# Patient Record
Sex: Male | Born: 1970 | Race: White | Hispanic: Yes | Marital: Married | Smoking: Former smoker
Health system: Southern US, Community
[De-identification: ages and names within clinical notes are randomized; demographics above are authoritative.]

## PROBLEM LIST (undated history)

## (undated) DIAGNOSIS — E7800 Pure hypercholesterolemia, unspecified: Secondary | ICD-10-CM

## (undated) DIAGNOSIS — I10 Essential (primary) hypertension: Secondary | ICD-10-CM

## (undated) HISTORY — PX: VASECTOMY: SHX75

---

## 2017-07-24 ENCOUNTER — Other Ambulatory Visit: Payer: Self-pay

## 2017-07-24 ENCOUNTER — Emergency Department (HOSPITAL_BASED_OUTPATIENT_CLINIC_OR_DEPARTMENT_OTHER)
Admission: EM | Admit: 2017-07-24 | Discharge: 2017-07-24 | Disposition: A | Discharge status: Home / Self Care | Payer: 59 | Attending: Emergency Medicine | Admitting: Emergency Medicine

## 2017-07-24 ENCOUNTER — Emergency Department (HOSPITAL_BASED_OUTPATIENT_CLINIC_OR_DEPARTMENT_OTHER): Payer: 59

## 2017-07-24 ENCOUNTER — Encounter (HOSPITAL_BASED_OUTPATIENT_CLINIC_OR_DEPARTMENT_OTHER): Payer: Self-pay

## 2017-07-24 DIAGNOSIS — R0789 Other chest pain: Secondary | ICD-10-CM | POA: Diagnosis present

## 2017-07-24 DIAGNOSIS — Z87891 Personal history of nicotine dependence: Secondary | ICD-10-CM | POA: Insufficient documentation

## 2017-07-24 DIAGNOSIS — M940 Chondrocostal junction syndrome [Tietze]: Secondary | ICD-10-CM | POA: Insufficient documentation

## 2017-07-24 DIAGNOSIS — M79602 Pain in left arm: Secondary | ICD-10-CM | POA: Diagnosis not present

## 2017-07-24 LAB — BASIC METABOLIC PANEL
ANION GAP: 6 (ref 5–15)
BUN: 17 mg/dL (ref 6–20)
CALCIUM: 8.7 mg/dL — AB (ref 8.9–10.3)
CHLORIDE: 100 mmol/L — AB (ref 101–111)
CO2: 29 mmol/L (ref 22–32)
CREATININE: 0.87 mg/dL (ref 0.61–1.24)
GFR calc non Af Amer: >60 mL/min (ref >60)
Glucose, Bld: 95 mg/dL (ref 65–99)
Potassium: 3.8 mmol/L (ref 3.5–5.1)
SODIUM: 135 mmol/L (ref 135–145)

## 2017-07-24 LAB — CBC
HCT: 38.6 % — ABNORMAL LOW (ref 39.0–52.0)
HEMOGLOBIN: 12.7 g/dL — AB (ref 13.0–17.0)
MCH: 30 pg (ref 26.0–34.0)
MCHC: 32.9 g/dL (ref 30.0–36.0)
MCV: 91 fL (ref 78.0–100.0)
PLATELETS: 231 10*3/uL (ref 150–400)
RBC: 4.24 MIL/uL (ref 4.22–5.81)
RDW: 11.7 % (ref 11.5–15.5)
WBC: 9.3 10*3/uL (ref 4.0–10.5)

## 2017-07-24 LAB — TROPONIN I

## 2017-07-24 MED ORDER — IBUPROFEN 600 MG PO TABS: 600.0000 mg | ORAL_TABLET | Freq: Three times a day (TID) | ORAL | 0 refills | Status: AC

## 2017-07-24 MED ORDER — KETOROLAC TROMETHAMINE 15 MG/ML IJ SOLN
15.0000 mg | Freq: Once | INTRAMUSCULAR | Status: AC
  Administered 2017-07-24: 15 mg via INTRAVENOUS
  Filled 2017-07-24: qty 1

## 2017-07-24 MED ORDER — METHOCARBAMOL 1000 MG/10ML IJ SOLN
500.0000 mg | Freq: Once | INTRAMUSCULAR | Status: AC
  Administered 2017-07-24: 500 mg via INTRAVENOUS
  Filled 2017-07-24: qty 10

## 2017-07-24 MED ORDER — METHOCARBAMOL 500 MG PO TABS: 500.0000 mg | ORAL_TABLET | Freq: Three times a day (TID) | ORAL | 0 refills | Status: DC | PRN

## 2017-07-24 NOTE — ED Provider Notes (Signed)
[facility] EMERGENCY DEPARTMENT Provider Note   CSN: [id number] Arrival date & time: 07/24/17  1843     History   Chief Complaint Chief Complaint  Patient presents with  . Chest Pain    HPI Shelbie ProctorJaime Mannella is a 46 y.o. male.  HPI   46 year old male with past medical history as below here with atypical chest pain.  The patient states that over the last 2 weeks, he has had nearly constant left chest pain.  The pain is sharp, stabbing, and positional.  It is worse with weight lifting, which he does regularly.  He denies any associated shortness of breath.  Earlier today, he had some pain that radiated and tingle down his left arm.  He states it radiates towards his left pinky finger. No neck pain with it. Pt's brother just passed from an unexpected heart attack last week so he became concerned. No h/o CAD. He lifts weights and does cardio regularly and was able to run 10 clicks yesterday, after hte onset of pain, without difficulty. His pain did worsen when he was lifting weights though.  History reviewed. No pertinent past medical history.  There are no active problems to display for this patient.   History reviewed. No pertinent surgical history.     Home Medications    Prior to Admission medications   Medication Sig Start Date End Date Taking? Authorizing Provider  ibuprofen (ADVIL,MOTRIN) 600 MG tablet Take 1 tablet (600 mg total) by mouth 3 (three) times daily for 7 days. 07/24/17 07/31/17  Shaune PollackIsaacs, Shizuye Rupert, MD  methocarbamol (ROBAXIN) 500 MG tablet Take 1 tablet (500 mg total) by mouth every 8 (eight) hours as needed for muscle spasms. 07/24/17   Shaune PollackIsaacs, Mekhi Sonn, MD    Family History No family history on file.  Social History Social History   Tobacco Use  . Smoking status: Former Smoker    Last attempt to quit: 07/25/1999    Years since quitting: 18.0  . Smokeless tobacco: Never Used  Substance Use Topics  . Alcohol use: Yes    Comment: occasionally     . Drug use: No     Allergies   Patient has no known allergies.   Review of Systems Review of Systems  Constitutional: Negative for chills, fatigue and fever.  HENT: Negative for congestion and rhinorrhea.   Eyes: Negative for visual disturbance.  Respiratory: Positive for chest tightness. Negative for cough, shortness of breath and wheezing.   Cardiovascular: Positive for chest pain. Negative for leg swelling.  Gastrointestinal: Negative for abdominal pain, diarrhea, nausea and vomiting.  Genitourinary: Negative for dysuria and flank pain.  Musculoskeletal: Negative for neck pain and neck stiffness.  Skin: Negative for rash and wound.  Allergic/Immunologic: Negative for immunocompromised state.  Neurological: Negative for syncope, weakness and headaches.  All other systems reviewed and are negative.    Physical Exam Updated Vital Signs BP 140/90 (BP Location: Right Arm)   Pulse (!) 57   Temp 98 F (36.7 C) (Oral)   Resp 18   Ht 5\' 10"  (1.778 m)   Wt 79.4 kg (175 lb)   SpO2 99%   BMI 25.11 kg/m   Physical Exam  Constitutional: He is oriented to person, place, and time. He appears well-developed and well-nourished. No distress.  HENT:  Head: Normocephalic and atraumatic.  Eyes: Conjunctivae are normal.  Neck: Neck supple.  No midline neck pain or tenderness. Negative Spurling.  Cardiovascular: Normal rate, regular rhythm and normal heart sounds. Exam reveals  no friction rub.  No murmur heard. Pulmonary/Chest: Effort normal and breath sounds normal. No respiratory distress. He has no wheezes. He has no rales.  Moderate TTP over left parasternal intercostal borders. No bruising or deformity.  Abdominal: He exhibits no distension.  Musculoskeletal: He exhibits no edema.  Neurological: He is alert and oriented to person, place, and time. He exhibits normal muscle tone.  Skin: Skin is warm. Capillary refill takes less than 2 seconds.  Psychiatric: He has a normal mood  and affect.  Nursing note and vitals reviewed.   UPPER EXTREMITY EXAM: LEFT  INSPECTION & PALPATION: No TTP or deformity.  SENSORY: Sensation is intact to light touch in:  Superficial radial nerve distribution (dorsal first web space) Median nerve distribution (tip of index finger)   Ulnar nerve distribution (tip of small finger)     MOTOR:  + Motor posterior interosseous nerve (thumb IP extension) + Anterior interosseous nerve (thumb IP flexion, index finger DIP flexion) + Radial nerve (wrist extension) + Median nerve (palpable firing thenar mass) + Ulnar nerve (palpable firing of first dorsal interosseous muscle)  VASCULAR: 2+ radial pulse Brisk capillary refill < 2 sec, fingers warm and well-perfused   ED Treatments / Results  Labs (all labs ordered are listed, but only abnormal results are displayed) Labs Reviewed  BASIC METABOLIC PANEL - Abnormal; Notable for the following components:      Result Value   Chloride 100 (*)    Calcium 8.7 (*)    All other components within normal limits  CBC - Abnormal; Notable for the following components:   Hemoglobin 12.7 (*)    HCT 38.6 (*)    All other components within normal limits  TROPONIN I    EKG  EKG Interpretation  Date/Time:  Thursday July 24 2017 18:49:23 EST Ventricular Rate:  68 PR Interval:    QRS Duration: 91 QT Interval:  394 QTC Calculation: 419 R Axis:   50 Text Interpretation:  Sinus rhythm Baseline wander in lead(s) I aVR aVL V1 V3 V5 No significant change since last tracing Confirmed by Shaune Pollack [phone]) on 07/24/2017 7:01:53 PM       Radiology Dg Chest 2 View  Result Date: 07/24/2017 CLINICAL DATA:  Nurse in the room Chest pressure that radiates down left arm x 1 week; some lightheadedness EXAM: CHEST  2 VIEW COMPARISON:  None. FINDINGS: Midline trachea.  Normal heart size and mediastinal contours. Sharp costophrenic angles.  No pneumothorax.  Clear lungs. IMPRESSION: No active  cardiopulmonary disease. Electronically Signed   By: Jeronimo Greaves M.D.   On: 07/24/2017 20:25    Procedures Procedures (including critical care time)  Medications Ordered in ED Medications  ketorolac (TORADOL) 15 MG/ML injection 15 mg (15 mg Intravenous Given 07/24/17 2021)  methocarbamol (ROBAXIN) injection 500 mg (500 mg Intravenous Given 07/24/17 2021)     Initial Impression / Assessment and Plan / ED Course  I have reviewed the triage vital signs and the nursing notes.  Pertinent labs & imaging results that were available during my care of the patient were reviewed by me and considered in my medical decision making (see chart for details).     46 yo M with PMHx as above here with atypical, positional, reproducible chest pain. Labs, imaging as above. EKG normal and trop neg despite sx >24 hours and unchanging - doubt ACS. He has no tachycardia, tachypnea, hypoxia, or s/s to suggest DVT or PE. Pain is not sharp, tearing, doubt dissection. CXR clear.  Sx started and are made worse with lifting weights and I suspect this is MSk chest wall/costsochondritis. Regarding his tingling in his hand - unclear etiology but no signs of neurovascular compromise. No neck pain. There may be a component of anxiety given brother's recent passing. No apparent emergent pathology. Will d/c with outpt follow-up.  Final Clinical Impressions(s) / ED Diagnoses   Final diagnoses:  Costochondritis  Atypical chest pain    ED Discharge Orders        Ordered    ibuprofen (ADVIL,MOTRIN) 600 MG tablet  3 times daily     07/24/17 2046    methocarbamol (ROBAXIN) 500 MG tablet  Every 8 hours PRN     07/24/17 2046       Shaune PollackIsaacs, Anjolaoluwa Siguenza, MD 07/24/17 2345

## 2017-07-24 NOTE — ED Notes (Signed)
ED Provider at bedside. 

## 2017-07-24 NOTE — ED Triage Notes (Signed)
Pt c/o chest pressure that radiates down L arm x 1 week. Pt also reports some lightheadedness. Pt endorses some associated ShOB times while at rest.

## 2020-05-05 DIAGNOSIS — K640 First degree hemorrhoids: Secondary | ICD-10-CM | POA: Diagnosis not present

## 2020-05-05 DIAGNOSIS — Z1211 Encounter for screening for malignant neoplasm of colon: Secondary | ICD-10-CM | POA: Diagnosis not present

## 2020-05-05 DIAGNOSIS — D122 Benign neoplasm of ascending colon: Secondary | ICD-10-CM | POA: Diagnosis not present

## 2020-05-05 DIAGNOSIS — Z86010 Personal history of colonic polyps: Secondary | ICD-10-CM | POA: Diagnosis not present

## 2020-05-05 DIAGNOSIS — D123 Benign neoplasm of transverse colon: Secondary | ICD-10-CM | POA: Diagnosis not present

## 2020-05-05 DIAGNOSIS — K635 Polyp of colon: Secondary | ICD-10-CM | POA: Diagnosis not present

## 2020-07-15 DIAGNOSIS — Z23 Encounter for immunization: Secondary | ICD-10-CM | POA: Diagnosis not present

## 2020-07-29 DIAGNOSIS — Z23 Encounter for immunization: Secondary | ICD-10-CM | POA: Diagnosis not present

## 2020-08-15 DIAGNOSIS — Z03818 Encounter for observation for suspected exposure to other biological agents ruled out: Secondary | ICD-10-CM | POA: Diagnosis not present

## 2020-12-11 DIAGNOSIS — J209 Acute bronchitis, unspecified: Secondary | ICD-10-CM | POA: Diagnosis not present

## 2021-01-11 DIAGNOSIS — Z8249 Family history of ischemic heart disease and other diseases of the circulatory system: Secondary | ICD-10-CM | POA: Diagnosis not present

## 2021-01-11 DIAGNOSIS — I517 Cardiomegaly: Secondary | ICD-10-CM | POA: Diagnosis not present

## 2021-01-12 DIAGNOSIS — I517 Cardiomegaly: Secondary | ICD-10-CM | POA: Diagnosis not present

## 2021-01-18 ENCOUNTER — Encounter (HOSPITAL_BASED_OUTPATIENT_CLINIC_OR_DEPARTMENT_OTHER): Payer: Self-pay

## 2021-01-18 ENCOUNTER — Observation Stay (HOSPITAL_BASED_OUTPATIENT_CLINIC_OR_DEPARTMENT_OTHER)
Admission: EM | Admit: 2021-01-18 | Discharge: 2021-01-19 | Disposition: A | Discharge status: Home / Self Care | Payer: BC Managed Care – PPO | Attending: Emergency Medicine | Admitting: Emergency Medicine

## 2021-01-18 ENCOUNTER — Emergency Department (HOSPITAL_BASED_OUTPATIENT_CLINIC_OR_DEPARTMENT_OTHER): Payer: BC Managed Care – PPO | Admitting: Radiology

## 2021-01-18 ENCOUNTER — Emergency Department (HOSPITAL_BASED_OUTPATIENT_CLINIC_OR_DEPARTMENT_OTHER): Payer: BC Managed Care – PPO

## 2021-01-18 ENCOUNTER — Other Ambulatory Visit: Payer: Self-pay

## 2021-01-18 DIAGNOSIS — Z87891 Personal history of nicotine dependence: Secondary | ICD-10-CM | POA: Diagnosis not present

## 2021-01-18 DIAGNOSIS — E785 Hyperlipidemia, unspecified: Secondary | ICD-10-CM | POA: Diagnosis present

## 2021-01-18 DIAGNOSIS — R079 Chest pain, unspecified: Secondary | ICD-10-CM | POA: Diagnosis present

## 2021-01-18 DIAGNOSIS — I10 Essential (primary) hypertension: Secondary | ICD-10-CM | POA: Diagnosis not present

## 2021-01-18 DIAGNOSIS — Z8249 Family history of ischemic heart disease and other diseases of the circulatory system: Secondary | ICD-10-CM | POA: Diagnosis not present

## 2021-01-18 DIAGNOSIS — I7090 Unspecified atherosclerosis: Secondary | ICD-10-CM | POA: Diagnosis not present

## 2021-01-18 DIAGNOSIS — E041 Nontoxic single thyroid nodule: Secondary | ICD-10-CM | POA: Diagnosis not present

## 2021-01-18 DIAGNOSIS — Z20822 Contact with and (suspected) exposure to covid-19: Secondary | ICD-10-CM | POA: Insufficient documentation

## 2021-01-18 DIAGNOSIS — Z79899 Other long term (current) drug therapy: Secondary | ICD-10-CM | POA: Diagnosis not present

## 2021-01-18 DIAGNOSIS — R0789 Other chest pain: Secondary | ICD-10-CM | POA: Diagnosis not present

## 2021-01-18 DIAGNOSIS — E042 Nontoxic multinodular goiter: Secondary | ICD-10-CM | POA: Diagnosis not present

## 2021-01-18 DIAGNOSIS — R202 Paresthesia of skin: Secondary | ICD-10-CM | POA: Diagnosis not present

## 2021-01-18 LAB — HIV ANTIBODY (ROUTINE TESTING W REFLEX): HIV Screen 4th Generation wRfx: NONREACTIVE

## 2021-01-18 LAB — CBC WITH DIFFERENTIAL/PLATELET
Abs Immature Granulocytes: 0.03 10*3/uL (ref 0.00–0.07)
Basophils Absolute: 0.1 10*3/uL (ref 0.0–0.1)
Basophils Relative: 1 %
Eosinophils Absolute: 0.1 10*3/uL (ref 0.0–0.5)
Eosinophils Relative: 1 %
HCT: 40.4 % (ref 39.0–52.0)
Hemoglobin: 13.5 g/dL (ref 13.0–17.0)
Immature Granulocytes: 1 %
Lymphocytes Relative: 26 %
Lymphs Abs: 1.6 10*3/uL (ref 0.7–4.0)
MCH: 30.5 pg (ref 26.0–34.0)
MCHC: 33.4 g/dL (ref 30.0–36.0)
MCV: 91.4 fL (ref 80.0–100.0)
Monocytes Absolute: 0.6 10*3/uL (ref 0.1–1.0)
Monocytes Relative: 10 %
Neutro Abs: 3.9 10*3/uL (ref 1.7–7.7)
Neutrophils Relative %: 61 %
Platelets: 250 10*3/uL (ref 150–400)
RBC: 4.42 MIL/uL (ref 4.22–5.81)
RDW: 11.7 % (ref 11.5–15.5)
WBC: 6.4 10*3/uL (ref 4.0–10.5)
nRBC: 0 % (ref 0.0–0.2)

## 2021-01-18 LAB — BASIC METABOLIC PANEL
Anion gap: 9 (ref 5–15)
BUN: 14 mg/dL (ref 6–20)
CO2: 28 mmol/L (ref 22–32)
Calcium: 8.8 mg/dL — ABNORMAL LOW (ref 8.9–10.3)
Chloride: 103 mmol/L (ref 98–111)
Creatinine, Ser: 0.87 mg/dL (ref 0.61–1.24)
GFR, Estimated: >60 mL/min (ref >60)
Glucose, Bld: 84 mg/dL (ref 70–99)
Potassium: 3.8 mmol/L (ref 3.5–5.1)
Sodium: 140 mmol/L (ref 135–145)

## 2021-01-18 LAB — D-DIMER, QUANTITATIVE: D-Dimer, Quant: 0.34 ug/mL-FEU (ref 0.00–0.50)

## 2021-01-18 LAB — RESP PANEL BY RT-PCR (FLU A&B, COVID) ARPGX2
Influenza A by PCR: NEGATIVE
Influenza B by PCR: NEGATIVE
SARS Coronavirus 2 by RT PCR: NEGATIVE

## 2021-01-18 LAB — SEDIMENTATION RATE: Sed Rate: 3 mm/hr (ref 0–16)

## 2021-01-18 LAB — TROPONIN I (HIGH SENSITIVITY)
Troponin I (High Sensitivity): 4 ng/L (ref <18)
Troponin I (High Sensitivity): 2 ng/L (ref <18)

## 2021-01-18 LAB — C-REACTIVE PROTEIN: CRP: 0.5 mg/dL (ref <1.0)

## 2021-01-18 MED ORDER — NITROGLYCERIN 0.4 MG SL SUBL
0.4000 mg | SUBLINGUAL_TABLET | SUBLINGUAL | Status: DC | PRN
Start: 2021-01-18 — End: 2021-01-18
  Administered 2021-01-18 (×2): 0.4 mg via SUBLINGUAL
  Filled 2021-01-18 (×2): qty 1

## 2021-01-18 MED ORDER — ACETAMINOPHEN 325 MG PO TABS: 650.0000 mg | ORAL_TABLET | ORAL | Status: DC | PRN

## 2021-01-18 MED ORDER — ATORVASTATIN CALCIUM 10 MG PO TABS
20.0000 mg | ORAL_TABLET | Freq: Every day | ORAL | Status: DC
  Administered 2021-01-18 – 2021-01-19 (×2): 20 mg via ORAL
  Filled 2021-01-18 (×2): qty 2

## 2021-01-18 MED ORDER — IOHEXOL 350 MG/ML SOLN
75.0000 mL | Freq: Once | INTRAVENOUS | Status: AC | PRN
  Administered 2021-01-18: 75 mL via INTRAVENOUS

## 2021-01-18 MED ORDER — ASPIRIN EC 81 MG PO TBEC
81.0000 mg | DELAYED_RELEASE_TABLET | Freq: Every day | ORAL | Status: DC
  Administered 2021-01-19: 81 mg via ORAL
  Filled 2021-01-18: qty 1

## 2021-01-18 MED ORDER — AMLODIPINE BESYLATE 5 MG PO TABS
5.0000 mg | ORAL_TABLET | Freq: Every day | ORAL | Status: DC
  Administered 2021-01-18 – 2021-01-19 (×2): 5 mg via ORAL
  Filled 2021-01-18 (×2): qty 1

## 2021-01-18 MED ORDER — ONDANSETRON HCL 4 MG/2ML IJ SOLN: 4.0000 mg | Freq: Four times a day (QID) | INTRAMUSCULAR | Status: DC | PRN

## 2021-01-18 MED ORDER — ENOXAPARIN SODIUM 40 MG/0.4ML IJ SOSY
40.0000 mg | PREFILLED_SYRINGE | INTRAMUSCULAR | Status: DC
  Administered 2021-01-18: 40 mg via SUBCUTANEOUS
  Filled 2021-01-18: qty 0.4

## 2021-01-18 MED ORDER — KETOROLAC TROMETHAMINE 30 MG/ML IJ SOLN
30.0000 mg | Freq: Once | INTRAMUSCULAR | Status: AC
  Administered 2021-01-18: 30 mg via INTRAVENOUS
  Filled 2021-01-18: qty 1

## 2021-01-18 MED ORDER — ASPIRIN 81 MG PO CHEW
324.0000 mg | CHEWABLE_TABLET | Freq: Once | ORAL | Status: AC
  Administered 2021-01-18: 324 mg via ORAL
  Filled 2021-01-18: qty 4

## 2021-01-18 NOTE — ED Notes (Signed)
[facility] at bedside 

## 2021-01-18 NOTE — ED Triage Notes (Signed)
Left sided chest pain x3 days. Patient states pain radiates to left shoulder, shoulder blade, and arm and hand are tingling. Pt not able to sleep due to pain

## 2021-01-18 NOTE — Progress Notes (Signed)
Notified by EDP of need for admission d/t chest pain. [facility] accepts patient to tele bed at [facility]. EDP is to remain responsible for orders/medical decisions while patient is holding at [facility]. Upon arrival to [facility], [facility] will assume care. Nursing staff will call flow manager/carelink to notify them of patient's arrival so that the proper [facility] member may receive the patient. Nursing staff will notify the following consultants, Cardiology (Dr. Bjorn Pippin), of patient's arrival for their evaluation. Thank you.

## 2021-01-18 NOTE — ED Provider Notes (Signed)
[facility] EMERGENCY DEPT Provider Note   CSN: [id number] Arrival date & time: 01/18/21  0630     History Chief Complaint  Patient presents with  . Chest Pain    Steven Mann is a 50 y.o. male presenting to the ED with chest pain.  He reports gradual onset of left sided chest pain 3 days ago on Monday, while at rest in his house.  He describes a squeezing sensation in his left chest with occasional sharp pinprick feeling.  It radiates toward his left shoulder.  It has been constantly present for the past 3 days.  It is 5/10 intensity currently.  He had a similar episode in 2019 prompting a first-time visit to cardiology, but has not had this issue since then.  Nothing makes it better or worse.  It does not change with deep inspiration or repositioning.   He reports ringing in his right ear that began with his symptoms on Monday.  He's never had this issue before.  He denies diminished hearing or ear drainage or pain.  Yesterday evening he played soccer and felt "off." He reports a feeling of numbness in his left hand as well which is new.  He denies nausea, vomiting, diarrhea, fevers or chills. He has had 3 doses of the covid vaccine.  He reports he has seen a cardiologist at [facility] over the past 3 years for an episode of chest pain that occurred in 2019.  He had a negative stress test at that time.  He has an echo scheduled in July 2022.   He denies history of angina or exertional chest pain prior to this episode. He is fit, active, works out regularly, [employer].  He does not have a history of HTN, HLD, Diabetes, smoking (quit in 2000). He denies personal or family history of aneurysms. He reports a family history of MI in his brother at age 3.  No hemoptysis or asymmetric LE edema. Patient denies personal or family history of DVT or PE. No recent hormone use (including OCP); travel for >6 hours; prolonged immobilization for greater than 3 days;  surgeries or trauma in the last 4 weeks; or malignancy with treatment within 6 months.   HPI     History reviewed. No pertinent past medical history.  Patient Active Problem List   Diagnosis Date Noted  . Chest pain 01/18/2021    History reviewed. No pertinent surgical history.     History reviewed. No pertinent family history.  Social History   Tobacco Use  . Smoking status: Former Smoker    Quit date: 07/25/1999    Years since quitting: 21.5  . Smokeless tobacco: Never Used  Vaping Use  . Vaping Use: Never used  Substance Use Topics  . Alcohol use: Yes    Comment: occasionally   . Drug use: No    Home Medications Prior to Admission medications   Medication Sig Start Date End Date Taking? Authorizing Provider  methocarbamol (ROBAXIN) 500 MG tablet Take 1 tablet (500 mg total) by mouth every 8 (eight) hours as needed for muscle spasms. 07/24/17   Shaune Pollack, MD    Allergies    Patient has no known allergies.  Review of Systems   Review of Systems  Constitutional: Negative for chills and fever.  HENT: Negative for ear pain and sore throat.   Eyes: Negative for photophobia and visual disturbance.  Respiratory: Positive for chest tightness. Negative for cough and shortness of breath.   Cardiovascular: Positive  for chest pain. Negative for palpitations.  Gastrointestinal: Negative for abdominal pain, nausea and vomiting.  Genitourinary: Negative for dysuria and hematuria.  Musculoskeletal: Positive for myalgias. Negative for arthralgias.  Skin: Negative for color change and rash.  Neurological: Positive for numbness. Negative for syncope and light-headedness.  All other systems reviewed and are negative.   Physical Exam Updated Vital Signs BP (!) 143/89   Pulse 67   Temp 97.7 F (36.5 C) (Oral)   Resp 15   Ht 5\' 10"  (1.778 m)   Wt 77.1 kg   SpO2 100%   BMI 24.39 kg/m   Physical Exam Constitutional:      General: He is not in acute  distress. HENT:     Head: Normocephalic and atraumatic.     Right Ear: Hearing, tympanic membrane, ear canal and external ear normal. No decreased hearing noted. No drainage. There is no impacted cerumen.     Left Ear: Hearing, tympanic membrane, ear canal and external ear normal. No decreased hearing noted. No drainage. There is no impacted cerumen.  Eyes:     Conjunctiva/sclera: Conjunctivae normal.     Pupils: Pupils are equal, round, and reactive to light.  Cardiovascular:     Rate and Rhythm: Normal rate and regular rhythm.     Pulses:          Carotid pulses are 0 on the right side and 0 on the left side. Pulmonary:     Effort: Pulmonary effort is normal. No respiratory distress.     Breath sounds: Normal breath sounds.  Chest:     Chest wall: No mass or tenderness.  Abdominal:     General: There is no distension.     Tenderness: There is no abdominal tenderness.  Musculoskeletal:        General: Normal range of motion.  Skin:    General: Skin is warm and dry.  Neurological:     General: No focal deficit present.     Mental Status: He is alert and oriented to person, place, and time. Mental status is at baseline.     Cranial Nerves: No cranial nerve deficit.     Motor: No weakness.     Comments: Paresthesias in ulnar nerve distribution left arm and hand, no weakness  Psychiatric:        Mood and Affect: Mood normal.        Behavior: Behavior normal.     ED Results / Procedures / Treatments   Labs (all labs ordered are listed, but only abnormal results are displayed) Labs Reviewed  BASIC METABOLIC PANEL - Abnormal; Notable for the following components:      Result Value   Calcium 8.8 (*)    All other components within normal limits  RESP PANEL BY RT-PCR (FLU A&B, COVID) ARPGX2  CBC WITH DIFFERENTIAL/PLATELET  D-DIMER, QUANTITATIVE  TROPONIN I (HIGH SENSITIVITY)  TROPONIN I (HIGH SENSITIVITY)    EKG EKG Interpretation  Date/Time:  Thursday Jan 18 2021  06:41:49 EDT Ventricular Rate:  64 PR Interval:  200 QRS Duration: 94 QT Interval:  403 QTC Calculation: 416 R Axis:   59 Text Interpretation: Sinus rhythm Probable left ventricular hypertrophy ST elev, probable normal early repol pattern Baseline wander in lead(s) V3 Confirmed by [id number] [phone]) on 01/18/2021 6:57:32 AM   Radiology DG Chest 2 View  Result Date: 01/18/2021 CLINICAL DATA:  51 year old with chest pain. EXAM: CHEST - 2 VIEW COMPARISON:  None. FINDINGS: The heart size and mediastinal contours  are within normal limits. Both lungs are clear. The visualized skeletal structures are unremarkable. IMPRESSION: No active cardiopulmonary disease. Electronically Signed   By: Richarda Overlie M.D.   On: 01/18/2021 07:50   CT Head Wo Contrast  Result Date: 01/18/2021 CLINICAL DATA:  Left arm numbness. EXAM: CT HEAD WITHOUT CONTRAST TECHNIQUE: Contiguous axial images were obtained from the base of the skull through the vertex without intravenous contrast. COMPARISON:  None. FINDINGS: Brain: No evidence of acute infarction, hemorrhage, hydrocephalus, extra-axial collection or mass lesion/mass effect. Vascular: No hyperdense vessel or unexpected calcification. Skull: Normal. Negative for fracture or focal lesion. Sinuses/Orbits: No acute finding. Other: None. IMPRESSION: No intracranial abnormality seen. Electronically Signed   By: Lupita Raider M.D.   On: 01/18/2021 08:56   CT ANGIO CHEST AORTA W/CM & OR WO/CM  Result Date: 01/18/2021 CLINICAL DATA:  Chest pain with aortic dissection suspected EXAM: CT ANGIOGRAPHY CHEST WITH CONTRAST TECHNIQUE: Multidetector CT imaging of the chest was performed using the standard protocol during bolus administration of intravenous contrast. Multiplanar CT image reconstructions and MIPs were obtained to evaluate the vascular anatomy. CONTRAST:  102mL OMNIPAQUE IOHEXOL 350 MG/ML SOLN COMPARISON:  None. FINDINGS: Cardiovascular: No aortic intramural hemotoma.  Preferential opacification of the thoracic aorta. No evidence of thoracic aortic aneurysm or dissection. Normal heart size. No pericardial effusion. Extensive coronary calcification. Mediastinum/Nodes: Subcentimeter thyroid nodules No followup recommended (ref: J Am Coll Radiol. 2015 Feb;12(2): 143-50). Lungs/Pleura: Lungs are clear. No pleural effusion or pneumothorax. Upper Abdomen: Negative Musculoskeletal: Negative Review of the MIP images confirms the above findings. IMPRESSION: 1. Normal CTA of the aorta. 2. Age advanced atheromatous coronary calcification. Electronically Signed   By: Marnee Spring M.D.   On: 01/18/2021 08:53      Medications Ordered in ED Medications  nitroGLYCERIN (NITROSTAT) SL tablet 0.4 mg (0.4 mg Sublingual Given 01/18/21 0739)  aspirin chewable tablet 324 mg (324 mg Oral Given 01/18/21 0733)  iohexol (OMNIPAQUE) 350 MG/ML injection 75 mL (75 mLs Intravenous Contrast Given 01/18/21 0826)  ketorolac (TORADOL) 30 MG/ML injection 30 mg (30 mg Intravenous Given 01/18/21 0955)    ED Course  I have reviewed the triage vital signs and the nursing notes.  Pertinent labs & imaging results that were available during my care of the patient were reviewed by me and considered in my medical decision making (see chart for details).  This patient presents to the Emergency Department with complaint of chest pain, left hand numbness, tinnitis in his right ear. This involves an extensive number of treatment options, and is a complaint that carries with it a high risk of complications and morbidity.  The differential diagnosis includes ACS vs PE vs aortic dissection vs Pneumothorax vs Reflux/Gastritis vs MSK pain vs Pneumonia vs other.  He is well appearing on arrival, only mildly hypertensive, but not in significant distress.   ECG reviewed showing NSR with likely BER, no significant changes from prior tracing, does not appear to be a STEMI.  It is not clear if the tinnitis is truly  associated with the remainder of his symptoms.  This is unlikely to be related to ototoxicity from any medications.  He has no headache or neck stiffness or pain to suspect vertebral or vascular dissection, or SAH.  No reported history of aneurysm.  We can obtain a screening CT scan to evaluate for intracranial mass.  His left hand paresthesias appear to be in an ulnar distribution and are more likely peripheral than central in origin.  I ordered, reviewed, and interpreted labs, including BMP and CBC.  There were no immediate, life-threatening emergencies found in this labwork.  Ddimer was negative.  The patient's troponin level was 4 -> 2.  Covid/flu negative. I ordered medication SL nitro for chest pain, with no relief of chest pain.   I ordered imaging studies which included dg chest, CT dissection chest, CTH I independently visualized and interpreted imaging which showed no acute dissection, diffuse coronary calcifications, no PNA or PTX, and the monitor tracing which showed NSR Previous records obtained and reviewed showing cardiology office visit on 01/11/21 with [facility] confirming normal stress test in 2019 which was negative for ischemia   Clinical Course as of 01/18/21 1153  Thu Jan 18, 2021  [id number] He reports his chest pain did not improve with nitroglycerin.  We discussed a dissection scan, which she is in agreement with.  I have ordered this now. [MT]  0902 No acute dissection.  He continues having 4/10 chest discomfort.  I will page cardiology to discuss this case. [MT]  [id number] CTH unremarkable - no evidence of large mass or infarct. [MT]  [id number] I spoke to Dr Bjorn PippinSchumann from cardiology.  He agrees with plan for observation and possible stress test in the morning.  The patient is also in agreement.  Will page hospitalist for admission [MT]  1020 Signed out to hospitalist Dr Ronaldo MiyamotoKyle for [facility] [MT]    Clinical Course User Index [MT] Renaye Rakersrifan, Kermit BaloMatthew J, MD   Final Clinical  Impression(s) / ED Diagnoses Final diagnoses:  Chest pain, unspecified type    Rx / DC Orders ED Discharge Orders    None       Terald Sleeperrifan, Donicia Druck J, MD 01/18/21 1314

## 2021-01-18 NOTE — H&P (Addendum)
History and Physical    Steven Mann LXB:[id number] DOB: 1970-10-26 DOA: 01/18/2021  PCP: Burnis Medin, PA-C Consultants:  Judithe Modest - cardiology; [place] - GI Patient coming from:  Home - lives with wife and chlidren; [place]: Wife, [phone]   Chief Complaint: CP  HPI: Steven Mann is a 50 y.o. male without significant medical history presenting with CP.  Monday, he started with mild left-sided chest discomfort and he ignored it, thinking he worked out too hard.  It had been worsening to the point where he couldn't sleep.  Last night he couldn't sleep at all and he started feeling dizzy and so his wife sent him to the ER.   At [facility], the pain was as high as 7/10 but currently it is 2/19.  Not exertional.  He played soccer last night for several hours without worsening in the pain.  He did notice some pain in L shoulder, back, tingling in fingers last night.  He did see a cardiologist last week incidentally - he had a physical recently and he had an EKG and there were some abnormalities and so he was sent to see cardiology.  He is scheduled for an echo on 7/7.  He is scheduled to go to [place] on 6/14.    ED Course:  [facility] to [facility] transfer, per Dr. Ronaldo Miyamoto:  50 yo male w/ no medical history. Presenting with 3 days of left side CP. It radiates down his arm and into his hand. He has numbness with it. It's a squeezing pain that is exacerbated with activity. Was trying to play soccer yesterday and these symptoms shut him down. Lab work at [facility] is negative. EKG is ok. He has a strong famHx of MI (brothers, uncles w/ events in their 44s). A aortic dissection study showed extensive coronary calcification. EDP spoke with cards Bjorn Pippin) who agree that a stress test would be warranted in his case and will consult on a hospitalist admission.    Review of Systems: As per HPI; otherwise review of systems reviewed and negative.   Ambulatory Status:  Ambulates without assistance  COVID Vaccine Status:   Complete plus booster  History reviewed. No pertinent past medical history.  Past Surgical History:  Procedure Laterality Date  . VASECTOMY      Social History   Socioeconomic History  . Marital status: Married    Spouse name: Not on file  . Number of children: Not on file  . Years of education: Not on file  . Highest education level: Not on file  Occupational History  . Occupation: Surveyor, minerals  Tobacco Use  . Smoking status: Former Smoker    Years: 15.00    Quit date: 07/25/1999    Years since quitting: 21.5  . Smokeless tobacco: Never Used  Vaping Use  . Vaping Use: Never used  Substance and Sexual Activity  . Alcohol use: Yes    Comment: occasionally   . Drug use: No  . Sexual activity: Not on file  Other Topics Concern  . Not on file  Social History Narrative  . Not on file   Social Determinants of Health   Financial Resource Strain: Not on file  Food Insecurity: Not on file  Transportation Needs: Not on file  Physical Activity: Not on file  Stress: Not on file  Social Connections: Not on file  Intimate Partner Violence: Not on file    No Known Allergies  Family History  Problem Relation Age of Onset  . Heart disease Father  pacemaker, carotid stenosis  . CAD Brother        sudden death MI at age 34y  . CAD Maternal Grandfather        died of MI in early 31s  . CVA Paternal Grandfather        3-70    Prior to Admission medications   Medication Sig Start Date End Date Taking? Authorizing Provider  methocarbamol (ROBAXIN) 500 MG tablet Take 1 tablet (500 mg total) by mouth every 8 (eight) hours as needed for muscle spasms. 07/24/17   Shaune Pollack, MD    Physical Exam: Vitals:   01/18/21 1030 01/18/21 1152 01/18/21 1345 01/18/21 1535  BP: (!) 143/89 139/88 (!) 143/88 (!) 156/90  Pulse: 67 66 65 63  Resp: 15 16 13 14   Temp:  98.4 F (36.9 C)  98.1 F (36.7 C)  TempSrc:  Oral  Oral  SpO2: 100%  100% 100%  Weight:    76.6 kg   Height:    5\' 10"  (1.778 m)     . General:  Appears calm and comfortable and is in NAD . Eyes:  PERRL, EOMI, normal lids, iris . ENT:  grossly normal hearing, lips & tongue, mmm; appropriate dentition . Neck:  no LAD, masses or thyromegaly . Cardiovascular:  RRR, no m/r/g. No LE edema.  Respiratory:   CTA bilaterally with no wheezes/rales/rhonchi.  Normal respiratory effort. . Abdomen:  soft, NT, ND . Skin:  no rash or induration seen on limited exam . Musculoskeletal:  grossly normal tone BUE/BLE, good ROM, no bony abnormality . Psychiatric:  grossly normal mood and affect, speech fluent and appropriate, AOx3 . Neurologic:  CN 2-12 grossly intact, moves all extremities in coordinated fashion    Radiological Exams on Admission: Independently reviewed - see discussion in A/P where applicable  DG Chest 2 View  Result Date: 01/18/2021 CLINICAL DATA:  50 year old with chest pain. EXAM: CHEST - 2 VIEW COMPARISON:  None. FINDINGS: The heart size and mediastinal contours are within normal limits. Both lungs are clear. The visualized skeletal structures are unremarkable. IMPRESSION: No active cardiopulmonary disease. Electronically Signed   By: 01/20/2021 M.D.   On: 01/18/2021 07:50   CT Head Wo Contrast  Result Date: 01/18/2021 CLINICAL DATA:  Left arm numbness. EXAM: CT HEAD WITHOUT CONTRAST TECHNIQUE: Contiguous axial images were obtained from the base of the skull through the vertex without intravenous contrast. COMPARISON:  None. FINDINGS: Brain: No evidence of acute infarction, hemorrhage, hydrocephalus, extra-axial collection or mass lesion/mass effect. Vascular: No hyperdense vessel or unexpected calcification. Skull: Normal. Negative for fracture or focal lesion. Sinuses/Orbits: No acute finding. Other: None. IMPRESSION: No intracranial abnormality seen. Electronically Signed   By: 01/20/2021 M.D.   On: 01/18/2021 08:56   CT ANGIO CHEST AORTA W/CM & OR WO/CM  Result Date:  01/18/2021 CLINICAL DATA:  Chest pain with aortic dissection suspected EXAM: CT ANGIOGRAPHY CHEST WITH CONTRAST TECHNIQUE: Multidetector CT imaging of the chest was performed using the standard protocol during bolus administration of intravenous contrast. Multiplanar CT image reconstructions and MIPs were obtained to evaluate the vascular anatomy. CONTRAST:  1mL OMNIPAQUE IOHEXOL 350 MG/ML SOLN COMPARISON:  None. FINDINGS: Cardiovascular: No aortic intramural hemotoma. Preferential opacification of the thoracic aorta. No evidence of thoracic aortic aneurysm or dissection. Normal heart size. No pericardial effusion. Extensive coronary calcification. Mediastinum/Nodes: Subcentimeter thyroid nodules No followup recommended (ref: J Am Coll Radiol. 2015 Feb;12(2): 143-50). Lungs/Pleura: Lungs are clear. No pleural effusion or  pneumothorax. Upper Abdomen: Negative Musculoskeletal: Negative Review of the MIP images confirms the above findings. IMPRESSION: 1. Normal CTA of the aorta. 2. Age advanced atheromatous coronary calcification. Electronically Signed   By: Marnee Spring M.D.   On: 01/18/2021 08:53    EKG: Independently reviewed.   9604 - NSR with rate 64; nonspecific ST changes with no evidence of acute ischemia 1433 - NSR with rate 64; no evidence of acute ischemia   Labs on Admission: I have personally reviewed the available labs and imaging studies at the time of the admission.  Pertinent labs:   Unremarkable BMP Normal CBC HS troponin 4, 2 D-dimer 0.34 COVID/flu negative   Assessment/Plan Principal Problem:   Chest pain of uncertain etiology Active Problems:   Family history of early CAD   Dyslipidemia   Chest pain -Patient without significant medical problems but strong FH of early CAD presenting with left-sided/substernal chest pressure that has come on intermittently for days, non-exertional. -1/3 typical symptoms suggestive of noncardiac chest pain.  -CXR unremarkable.   -CTA  performed and shows age-advanced ASCVD suggestive of possible CAD causing chest pain -Initial cardiac HS troponin negative.  -EKG not indicative of acute ischemia.   -HEART pathway score is 4, indicating that the patient has an elevated risk score and requires further evaluation. -Will plan to place in observation status on telemetry to rule out ACS by overnight observation.  -Start ASA 81 mg daily -Cardiology consultation  -Stress test in 2019 with [facility] protocol was negative -NPO for possible stress test vs. Cath -Risk factor stratification with A1c, lipids  HLD -Start Lipitor -Lipids were checked in 2019 with LDL 107; will repeat lipid panel   Note: This patient has been tested and is negative for the novel coronavirus COVID-19. He has been fully vaccinated against COVID-19.    DVT prophylaxis: Lovenox  Code Status:  Full - confirmed with patient Family Communication: None present Disposition Plan:  The patient is from: home  Anticipated d/c is to: home without [facility] services  Anticipated d/c date will depend on clinical response to treatment, but possibly as early as tomorrow if he has excellent response to treatment  Patient is currently: acutely ill Consults called: Cardiology  Admission status: It is my clinical opinion that referral for OBSERVATION is reasonable and necessary in this patient based on the above information provided. The aforementioned taken together are felt to place the patient at high risk for further clinical deterioration. However it is anticipated that the patient may be medically stable for discharge from the hospital within 24 to 48 hours.     Jonah Blue MD [facility]   How to contact the [facility] Attending or Consulting provider 7A - 7P or covering provider during after hours 7P -7A, for this patient?  1. Check the care team in [facility] and look for a) attending/consulting TR[facility] provider listed and b) the [facility] team listed 2. Log into [email or web address]  and use [place]'s universal password to access. If you do not have the password, please contact the hospital operator. 3. Locate the [facility] provider you are looking for under [facility] and page to a number that you can be directly reached. 4. If you still have difficulty reaching the provider, please page the [facility] (Director on Call) for the Hospitalists listed on amion for assistance.   01/18/2021, 5:19 PM

## 2021-01-18 NOTE — ED Notes (Signed)
Pt reports no difference in his pain level with 2nd nitro, states he is lightheaded and that he is starting to get a headache. Does not want to take any more nitro. BP 114/71

## 2021-01-18 NOTE — ED Provider Notes (Signed)
Emergency Medicine Provider Triage Evaluation Note  Steven Mann , a 50 y.o. male  was evaluated in triage.  Pt complains of Left sided chest pain x 3 days, constant but waxing and waning in severity. Now associated with tingling in jaw and left arm since last night which concerned him. Saw cardiologist on 5/19 due to "abnormal EKG' with LVH and was scheduled for an echo in July.Negative stress test in 2019. Played soccer yesterday without any exertional symptoms.  Review of Systems  Positive: Chest pain Negative: Shortness of breath, nausea, vomiting, sweating  Physical Exam  BP (!) 156/99 (BP Location: Right Arm)   Pulse 74   Temp 97.7 F (36.5 C) (Oral)   Resp 18   Ht 5\' 10"  (1.778 m)   Wt 77.1 kg   SpO2 100%   BMI 24.39 kg/m  Gen:   Awake, no distress   Resp:  Normal effort  MSK:   Moves extremities without difficulty  Other:  TTP L chest wall.   Medical Decision Making  Medically screening exam initiated at 6:55 AM.  Appropriate orders placed.  [name] was informed that the remainder of the evaluation will be completed by another provider, this initial triage assessment does not replace that evaluation, and the importance of remaining in the ED until their evaluation is complete.  EKG, labs, CXR Description somewhat atypical for ACS, pain is partially reproducible   [name], MD 01/18/21 [phone]

## 2021-01-18 NOTE — ED Notes (Signed)
Handoff report given to [facility] RN

## 2021-01-18 NOTE — ED Notes (Signed)
No relief from first nitro dose.

## 2021-01-18 NOTE — ED Notes (Signed)
Handoff report given to [facility] 

## 2021-01-18 NOTE — Consult Note (Addendum)
Cardiology Consultation:   Patient ID: Steven Mann MRN: [id number]; DOB: 1971-01-04  Admit date: 01/18/2021 Date of Consult: 01/18/2021  PCP:  Burnis Medin, PA-C   [facility] Providers Cardiologist:  Dr. Judithe Modest [facility])  Patient Profile:   Steven Mann is a 50 y.o. male with no prior cardiac history who is being seen 01/18/2021 for the evaluation of chest pain at the request of Dr. Ophelia Charter.  History of Present Illness:   Steven Mann with no significant medical history presented to [facility] with onset of chest pain three days ago. Chest pain is left sided, radiating to his jaw and left arm. He played soccer last night for several hours without chest pain. However, CP kept him up last night and was accompanied by dizziness, prompting ER evaluation.  HS troponin x 2 negative D-dimer negative  He has a strong family history of coronary artery disease, brother died of MI in his 57s. He is a former smoker (1 pack per week for 15 years).  He saw cardiology (Dr. Judithe Modest) in 2019 for chest discomfort.  He completed an exercise stress echo with no ischemia. He changed jobs recently and underwent an "executive physical" with EKG that showed LVH. He was referred back to cardiology and saw Dr. Judithe Modest on 01/11/21. At that appt, he denied angina. An echocardiogram was arranged for July.   During my interview, he describes a tightness in his chest that started Monday when he was just sitting working. He lifts weights on Mondays and lifted that day. The pain was described as constant and pressure-like with some radiation down his left arm. He thought he pulled a muscle. He continued to have pain Monday, Tues, and Wed. However, he was able to play soccer for three hours Wed afternoon without chest pain. Wed evening, he was unable to get comfortable and became dizzy - chest pain radiated down left arm prompting ER evaluation. He denies dyspnea, orthopnea, and lower extremity edema. No recent  illness. CP has not been relieved by nitro x 2 or flexeril. CP is not pleuritic and not changed by position.    History reviewed. No pertinent past medical history.  Past Surgical History:  Procedure Laterality Date  . VASECTOMY       Home Medications:  Prior to Admission medications   Medication Sig Start Date End Date Taking? Authorizing Provider  ibuprofen (ADVIL) 200 MG tablet Take 400 mg by mouth every 6 (six) hours as needed for mild pain.   Yes [provider]  tetrahydrozoline-zinc (VISINE-AC) 0.05-0.25 % ophthalmic solution Place 2 drops into both eyes daily as needed (dry eyes).   Yes [provider]    Inpatient Medications: Scheduled Meds: . [START ON 01/19/2021] aspirin EC  81 mg Oral Daily  . atorvastatin  20 mg Oral Daily  . enoxaparin (LOVENOX) injection  40 mg Subcutaneous Q24H   Continuous Infusions:  PRN Meds: acetaminophen, ondansetron (ZOFRAN) IV  Allergies:   No Known Allergies  Social History:   Social History   Socioeconomic History  . Marital status: Married    Spouse name: Not on file  . Number of children: Not on file  . Years of education: Not on file  . Highest education level: Not on file  Occupational History  . Occupation: Surveyor, minerals  Tobacco Use  . Smoking status: Former Smoker    Years: 15.00    Quit date: 07/25/1999    Years since quitting: 21.5  . Smokeless tobacco: Never Used  Vaping Use  . Vaping Use: Never used  Substance and Sexual Activity  . Alcohol use: Yes    Comment: occasionally   . Drug use: No  . Sexual activity: Not on file  Other Topics Concern  . Not on file  Social History Narrative  . Not on file   Social Determinants of Health   Financial Resource Strain: Not on file  Food Insecurity: Not on file  Transportation Needs: Not on file  Physical Activity: Not on file  Stress: Not on file  Social Connections: Not on file  Intimate Partner Violence: Not on file    Family  History:    Family History  Problem Relation Age of Onset  . Heart disease Father        pacemaker, carotid stenosis  . CAD Brother        sudden death MI at age 64y  . CAD Maternal Grandfather        died of MI in early 42s  . CVA Paternal Grandfather        60-70     ROS:  Please see the history of present illness.   All other ROS reviewed and negative.     Physical Exam/Data:   Vitals:   01/18/21 1030 01/18/21 1152 01/18/21 1345 01/18/21 1535  BP: (!) 143/89 139/88 (!) 143/88 (!) 156/90  Pulse: 67 66 65 63  Resp: 15 16 13 14   Temp:  98.4 F (36.9 C)  98.1 F (36.7 C)  TempSrc:  Oral  Oral  SpO2: 100%  100% 100%  Weight:    76.6 kg  Height:    5\' 10"  (1.778 m)   No intake or output data in the 24 hours ending 01/18/21 1852 Last 3 Weights 01/18/2021 01/18/2021 07/24/2017  Weight (lbs) 168 lb 12.8 oz 170 lb 175 lb  Weight (kg) 76.567 kg 77.111 kg 79.379 kg     Body mass index is 24.22 kg/m.  General:  Well nourished, well developed, in no acute distress Neck: no JVD Vascular: No carotid bruits Cardiac:  normal S1, S2; RRR; no murmur  Lungs:  clear to auscultation bilaterally, no wheezing, rhonchi or rales  Abd: soft, nontender, no hepatomegaly  Ext: no edema Musculoskeletal:  No deformities, BUE and BLE strength normal and equal Skin: warm and dry  Neuro:  CNs 2-12 intact, no focal abnormalities noted Psych:  Normal affect   EKG:  The EKG was personally reviewed and demonstrates:  Sinus rhythm with HR 67, LVH with repolarization abnormalities Telemetry:  Telemetry was personally reviewed and demonstrates:  Sinus rhythm with HR 60-70s  Relevant CV Studies:  none  Laboratory Data:  High Sensitivity Troponin:   Recent Labs  Lab 01/18/21 0641 01/18/21 0840  TROPONINIHS 4 2     Chemistry Recent Labs  Lab 01/18/21 0641  NA 140  K 3.8  CL 103  CO2 28  GLUCOSE 84  BUN 14  CREATININE 0.87  CALCIUM 8.8*  GFRNONAA >60  ANIONGAP 9    No results  for input(s): PROT, ALBUMIN, AST, ALT, ALKPHOS, BILITOT in the last 168 hours. Hematology Recent Labs  Lab 01/18/21 0641  WBC 6.4  RBC 4.42  HGB 13.5  HCT 40.4  MCV 91.4  MCH 30.5  MCHC 33.4  RDW 11.7  PLT 250   BNPNo results for input(s): BNP, PROBNP in the last 168 hours.  DDimer  Recent Labs  Lab 01/18/21 0732  DDIMER 0.34     Radiology/Studies:  01/20/21  Chest 2 View  Result Date: 01/18/2021 CLINICAL DATA:  50 year old with chest pain. EXAM: CHEST - 2 VIEW COMPARISON:  None. FINDINGS: The heart size and mediastinal contours are within normal limits. Both lungs are clear. The visualized skeletal structures are unremarkable. IMPRESSION: No active cardiopulmonary disease. Electronically Signed   By: Richarda Overlie M.D.   On: 01/18/2021 07:50   CT Head Wo Contrast  Result Date: 01/18/2021 CLINICAL DATA:  Left arm numbness. EXAM: CT HEAD WITHOUT CONTRAST TECHNIQUE: Contiguous axial images were obtained from the base of the skull through the vertex without intravenous contrast. COMPARISON:  None. FINDINGS: Brain: No evidence of acute infarction, hemorrhage, hydrocephalus, extra-axial collection or mass lesion/mass effect. Vascular: No hyperdense vessel or unexpected calcification. Skull: Normal. Negative for fracture or focal lesion. Sinuses/Orbits: No acute finding. Other: None. IMPRESSION: No intracranial abnormality seen. Electronically Signed   By: Lupita Raider M.D.   On: 01/18/2021 08:56   CT ANGIO CHEST AORTA W/CM & OR WO/CM  Result Date: 01/18/2021 CLINICAL DATA:  Chest pain with aortic dissection suspected EXAM: CT ANGIOGRAPHY CHEST WITH CONTRAST TECHNIQUE: Multidetector CT imaging of the chest was performed using the standard protocol during bolus administration of intravenous contrast. Multiplanar CT image reconstructions and MIPs were obtained to evaluate the vascular anatomy. CONTRAST:  36mL OMNIPAQUE IOHEXOL 350 MG/ML SOLN COMPARISON:  None. FINDINGS: Cardiovascular: No aortic  intramural hemotoma. Preferential opacification of the thoracic aorta. No evidence of thoracic aortic aneurysm or dissection. Normal heart size. No pericardial effusion. Extensive coronary calcification. Mediastinum/Nodes: Subcentimeter thyroid nodules No followup recommended (ref: J Am Coll Radiol. 2015 Feb;12(2): 143-50). Lungs/Pleura: Lungs are clear. No pleural effusion or pneumothorax. Upper Abdomen: Negative Musculoskeletal: Negative Review of the MIP images confirms the above findings. IMPRESSION: 1. Normal CTA of the aorta. 2. Age advanced atheromatous coronary calcification. Electronically Signed   By: Marnee Spring M.D.   On: 01/18/2021 08:53     Assessment and Plan:   Chest pain - hs troponin x 2 negative - coronary calcifications on CTA - EKG with repolarization abnormalities - describes typical and atypical features, was able to play soccer for three hours without pain - risk factors include family history of premature heart disease, former smoker, LDL > 100 - will obtain nuclear stress test tomorrow - given diffuse concave ST elevation on EKG likely representing early repol, will add on sed rate and CRP - he denied recent illness   Hypertension - has been hypertensive here - 150-180s - systolic was in the 180s when I was in the room - will add 5 mg amlodipine as an anti-hypertensive with anti-anginal benefit, may need to titrate up   Risk factor management - lipid profile in the AM - A1c pending - no longer smoking  - continues to exercise, not obese   NPO at [place] for nuclear stress test tomorrow. Will also obtain echocardiogram.   Risk Assessment/Risk Scores:     HEAR Score (for undifferentiated chest pain):  HEAR Score: 4     For questions or updates, please contact [facility] Please consult [email or web address] for contact info under    Signed, Marcelino Duster, [place]  01/18/2021 6:52 PM   Patient seen and examined.  Agree with above documentation.  Mr.  Mann is a 50 year old male with no significant past medical history who we are consulted to see by Dr. Ophelia Charter for evaluation of chest pain.  He reports that pain started on 5/23.  Describes left-sided chest tightness.  Pain has been  continuous since that time.  Reports that at baseline since then pain has been about 2 out of 10 in intensity, but will worsen to 7 out of 10.  He is not sure what causes exacerbations.  He played soccer yesterday and did not report worsening chest pain.  Pain is not worsened with deep inspiration and is not positional.  Given his chest pain, prompted him to present to [facility] today.  On presentation, vital signs notable for BP 156/99, pulse 74, SPO2 100% on room air.  Labs notable for creatinine 0.87, hemoglobin 13.5, troponin 4 > 2, D-dimer 0.3.  CTA chest was done which showed no aortic pathology but did show significant coronary calcifications.  EKG shows normal sinus rhythm, rate 67, subtle concave ST elevations likely representing early repolarization.  On exam, patient is alert and oriented, regular rate and rhythm, no murmurs, lungs CTAB, no LE edema or JVD.  For his chest pain, given continuous pain with negative troponins, suspect noncardiac chest pain.  However does have significant family history (brother died of MI at age 445) and significant coronary calcifications on CTA chest.  Recommend Lexiscan Myoview tomorrow to evaluate for ischemia.  Will also check echocardiogram to rule out structural heart disease.  Will check lipid panel and plan to start statin given significant coronary calcifications on CTA chest.  BP has been elevated, will start amlodipine 5 mg daily.  [place] Ish[place]awahristopher L Miesha Bachmann, MD

## 2021-01-19 ENCOUNTER — Observation Stay (HOSPITAL_BASED_OUTPATIENT_CLINIC_OR_DEPARTMENT_OTHER): Payer: BC Managed Care – PPO

## 2021-01-19 DIAGNOSIS — E785 Hyperlipidemia, unspecified: Secondary | ICD-10-CM | POA: Diagnosis not present

## 2021-01-19 DIAGNOSIS — Z8249 Family history of ischemic heart disease and other diseases of the circulatory system: Secondary | ICD-10-CM | POA: Diagnosis not present

## 2021-01-19 DIAGNOSIS — R079 Chest pain, unspecified: Secondary | ICD-10-CM

## 2021-01-19 DIAGNOSIS — I10 Essential (primary) hypertension: Secondary | ICD-10-CM | POA: Diagnosis not present

## 2021-01-19 DIAGNOSIS — I7090 Unspecified atherosclerosis: Secondary | ICD-10-CM | POA: Diagnosis not present

## 2021-01-19 DIAGNOSIS — R0789 Other chest pain: Secondary | ICD-10-CM | POA: Diagnosis not present

## 2021-01-19 DIAGNOSIS — Z20822 Contact with and (suspected) exposure to covid-19: Secondary | ICD-10-CM | POA: Diagnosis not present

## 2021-01-19 LAB — NM MYOCAR MULTI W/SPECT W/WALL MOTION / EF
Estimated workload: 1 METS
Exercise duration (min): 5 min
Exercise duration (sec): 18 s
MPHR: 170 {beats}/min
Peak HR: 125 {beats}/min
Percent HR: 73 %
Rest HR: 63 {beats}/min

## 2021-01-19 LAB — ECHOCARDIOGRAM COMPLETE
AR max vel: 4.54 cm2
AV Area VTI: 4.46 cm2
AV Area mean vel: 4.4 cm2
AV Mean grad: 4 mmHg
AV Peak grad: 7.2 mmHg
Ao pk vel: 1.34 m/s
Area-P 1/2: 2.79 cm2
Calc EF: 49.4 %
Height: 70 in
MV M vel: 4.86 m/s
MV Peak grad: 94.5 mmHg
MV VTI: 4.87 cm2
S' Lateral: 3.2 cm
Single Plane A2C EF: 43.8 %
Single Plane A4C EF: 54 %
Weight: 2700.8 oz

## 2021-01-19 LAB — LIPID PANEL
Cholesterol: 145 mg/dL (ref 0–200)
HDL: 49 mg/dL (ref >40)
LDL Cholesterol: 81 mg/dL (ref 0–99)
Total CHOL/HDL Ratio: 3 RATIO
Triglycerides: 74 mg/dL (ref <150)
VLDL: 15 mg/dL (ref 0–40)

## 2021-01-19 LAB — HEMOGLOBIN A1C
Hgb A1c MFr Bld: 5.4 % (ref 4.8–5.6)
Mean Plasma Glucose: 108 mg/dL

## 2021-01-19 MED ORDER — TECHNETIUM TC 99M TETROFOSMIN IV KIT
30.2000 | PACK | Freq: Once | INTRAVENOUS | Status: AC | PRN
  Administered 2021-01-19: 30.2 via INTRAVENOUS

## 2021-01-19 MED ORDER — AMLODIPINE BESYLATE 5 MG PO TABS: 5.0000 mg | ORAL_TABLET | Freq: Every day | ORAL | 0 refills | Status: DC

## 2021-01-19 MED ORDER — REGADENOSON 0.4 MG/5ML IV SOLN
INTRAVENOUS | Status: AC
  Administered 2021-01-19: 0.4 mg via INTRAVENOUS
  Filled 2021-01-19: qty 5

## 2021-01-19 MED ORDER — ASPIRIN 81 MG PO TBEC: 81.0000 mg | DELAYED_RELEASE_TABLET | Freq: Every day | ORAL | 0 refills | Status: DC

## 2021-01-19 MED ORDER — TECHNETIUM TC 99M TETROFOSMIN IV KIT
10.0000 | PACK | Freq: Once | INTRAVENOUS | Status: AC | PRN
  Administered 2021-01-19: 10 via INTRAVENOUS

## 2021-01-19 MED ORDER — REGADENOSON 0.4 MG/5ML IV SOLN
0.4000 mg | Freq: Once | INTRAVENOUS | Status: AC
  Filled 2021-01-19: qty 5

## 2021-01-19 MED ORDER — ATORVASTATIN CALCIUM 20 MG PO TABS: 20.0000 mg | ORAL_TABLET | Freq: Every day | ORAL | 0 refills | Status: DC

## 2021-01-19 NOTE — Progress Notes (Signed)
Normal stress test and echocardiogram.  No further cardiac work-up recommended.  [facility] will sign off.   Medication Recommendations: Start amlodipine 5 mg daily and atorvastatin 20 mg daily  Other recommendations (labs, testing, etc): None Follow up as an outpatient: We will schedule

## 2021-01-19 NOTE — Progress Notes (Signed)
*  PRELIMINARY RESULTS* Echocardiogram 2D Echocardiogram has been performed.  Neomia Dear RDCS 01/19/2021, 10:18 AM

## 2021-01-19 NOTE — Progress Notes (Signed)
   [name] presented for a nuclear stress test today.  No immediate complications.  Stress imaging is pending at this time.  Preliminary EKG findings may be listed in the chart, but the stress test result will not be finalized until perfusion imaging is complete.    Cyndi Bender, AGNP-BC 01/19/2021, 1:54 PM

## 2021-01-19 NOTE — Progress Notes (Signed)
Progress Note  Patient Name: Steven Mann Date of Encounter: 01/19/2021  [facility] Cardiologist: None   Subjective   Reports chest pain improved  Inpatient Medications    Scheduled Meds: . amLODipine  5 mg Oral Daily  . aspirin EC  81 mg Oral Daily  . atorvastatin  20 mg Oral Daily  . enoxaparin (LOVENOX) injection  40 mg Subcutaneous Q24H   Continuous Infusions:  PRN Meds: acetaminophen, ondansetron (ZOFRAN) IV   Vital Signs    Vitals:   01/18/21 1535 01/18/21 2034 01/18/21 2354 01/19/21 0407  BP: (!) 156/90 (!) 136/92 131/83 115/79  Pulse: 63 64 (!) 53 60  Resp: 14 15 19 16   Temp: 98.1 F (36.7 C) 98.4 F (36.9 C)  98.6 F (37 C)  TempSrc: Oral Oral  Oral  SpO2: 100%  98%   Weight: 76.6 kg     Height: 5' 10"  (1.778 m)      No intake or output data in the 24 hours ending 01/19/21 0934 Last 3 Weights 01/18/2021 01/18/2021 07/24/2017  Weight (lbs) 168 lb 12.8 oz 170 lb 175 lb  Weight (kg) 76.567 kg 77.111 kg 79.379 kg      Telemetry    NSR 40-60s - Personally Reviewed  ECG    No new ECG - Personally Reviewed  Physical Exam   GEN: No acute distress.   Neck: No JVD Cardiac: RRR, no murmurs, rubs, or gallops.  Respiratory: Clear to auscultation bilaterally. GI: Soft, nontender, non-distended  MS: No edema; No deformity. Neuro:  Nonfocal  Psych: Normal affect   Labs    High Sensitivity Troponin:   Recent Labs  Lab 01/18/21 0641 01/18/21 0840  TROPONINIHS 4 2      Chemistry Recent Labs  Lab 01/18/21 0641  NA 140  K 3.8  CL 103  CO2 28  GLUCOSE 84  BUN 14  CREATININE 0.87  CALCIUM 8.8*  GFRNONAA >60  ANIONGAP 9     Hematology Recent Labs  Lab 01/18/21 0641  WBC 6.4  RBC 4.42  HGB 13.5  HCT 40.4  MCV 91.4  MCH 30.5  MCHC 33.4  RDW 11.7  PLT 250    BNPNo results for input(s): BNP, PROBNP in the last 168 hours.   DDimer  Recent Labs  Lab 01/18/21 0732  DDIMER 0.34     Radiology    DG Chest 2  View  Result Date: 01/18/2021 CLINICAL DATA:  50 year old with chest pain. EXAM: CHEST - 2 VIEW COMPARISON:  None. FINDINGS: The heart size and mediastinal contours are within normal limits. Both lungs are clear. The visualized skeletal structures are unremarkable. IMPRESSION: No active cardiopulmonary disease. Electronically Signed   By: Markus Daft M.D.   On: 01/18/2021 07:50   CT Head Wo Contrast  Result Date: 01/18/2021 CLINICAL DATA:  Left arm numbness. EXAM: CT HEAD WITHOUT CONTRAST TECHNIQUE: Contiguous axial images were obtained from the base of the skull through the vertex without intravenous contrast. COMPARISON:  None. FINDINGS: Brain: No evidence of acute infarction, hemorrhage, hydrocephalus, extra-axial collection or mass lesion/mass effect. Vascular: No hyperdense vessel or unexpected calcification. Skull: Normal. Negative for fracture or focal lesion. Sinuses/Orbits: No acute finding. Other: None. IMPRESSION: No intracranial abnormality seen. Electronically Signed   By: Marijo Conception M.D.   On: 01/18/2021 08:56   CT ANGIO CHEST AORTA W/CM & OR WO/CM  Result Date: 01/18/2021 CLINICAL DATA:  Chest pain with aortic dissection suspected EXAM: CT ANGIOGRAPHY CHEST WITH CONTRAST TECHNIQUE:  Multidetector CT imaging of the chest was performed using the standard protocol during bolus administration of intravenous contrast. Multiplanar CT image reconstructions and MIPs were obtained to evaluate the vascular anatomy. CONTRAST:  55m OMNIPAQUE IOHEXOL 350 MG/ML SOLN COMPARISON:  None. FINDINGS: Cardiovascular: No aortic intramural hemotoma. Preferential opacification of the thoracic aorta. No evidence of thoracic aortic aneurysm or dissection. Normal heart size. No pericardial effusion. Extensive coronary calcification. Mediastinum/Nodes: Subcentimeter thyroid nodules No followup recommended (ref: J Am Coll Radiol. 2015 Feb;12(2): 143-50). Lungs/Pleura: Lungs are clear. No pleural effusion or  pneumothorax. Upper Abdomen: Negative Musculoskeletal: Negative Review of the MIP images confirms the above findings. IMPRESSION: 1. Normal CTA of the aorta. 2. Age advanced atheromatous coronary calcification. Electronically Signed   By: JMonte FantasiaM.D.   On: 01/18/2021 08:53    Cardiac Studies     Patient Profile     50y.o. male with no prior cardiac history who is being seen 01/18/2021 for the evaluation of chest pain  Assessment & Plan    Chest pain: atypical in description, but does have significant family history (brother died of MI at 417 and diffuse coronary calcifications on CTA chest.  Troponins negative.  Normal ESR/CRP. -Lexiscan Myoview today to evaluate for ischemia -Echocardiogram -Started atorvastatin given coronary calcification.  LDL 81, goal <70  Hypertension: new diagnosis, started amlodipine 5 mg daily  Hyperlipdemia: LDL 81, started atorvastatin 20 mg daily  For questions or updates, please contact [place]toPlease consult [email or web address] for contact info under        Signed, CDonato Heinz MD  01/19/2021, 9:34 AM

## 2021-01-19 NOTE — Discharge Instructions (Signed)
High Cholesterol  High cholesterol is a condition in which the blood has high levels of a white, waxy substance similar to fat (cholesterol). The liver makes all the cholesterol that the body needs. The human body needs small amounts of cholesterol to help build cells. A person gets extra or excess cholesterol from the food that he or she eats. The blood carries cholesterol from the liver to the rest of the body. If you have high cholesterol, deposits (plaques) may build up on the walls of your arteries. Arteries are the blood vessels that carry blood away from your heart. These plaques make the arteries narrow and stiff. Cholesterol plaques increase your risk for heart attack and stroke. Work with your health care provider to keep your cholesterol levels in a healthy range. What increases the risk? The following factors may make you more likely to develop this condition:  Eating foods that are high in animal fat (saturated fat) or cholesterol.  Being overweight.  Not getting enough exercise.  A family history of high cholesterol (familial hypercholesterolemia).  Use of tobacco products.  Having diabetes. What are the signs or symptoms? There are no symptoms of this condition. How is this diagnosed? This condition may be diagnosed based on the results of a blood test.  If you are older than 51 years of age, your health care provider may check your cholesterol levels every 4-6 years.  You may be checked more often if you have high cholesterol or other risk factors for heart disease. The blood test for cholesterol measures:  "Bad" cholesterol, or LDL cholesterol. This is the main type of cholesterol that causes heart disease. The desired level is less than 100 mg/dL.  "Good" cholesterol, or HDL cholesterol. HDL helps protect against heart disease by cleaning the arteries and carrying the LDL to the liver for processing. The desired level for HDL is 60 mg/dL or higher.  Triglycerides.  These are fats that your body can store or burn for energy. The desired level is less than 150 mg/dL.  Total cholesterol. This measures the total amount of cholesterol in your blood and includes LDL, HDL, and triglycerides. The desired level is less than 200 mg/dL. How is this treated? This condition may be treated with:  Diet changes. You may be asked to eat foods that have more fiber and less saturated fats or added sugar.  Lifestyle changes. These may include regular exercise, maintaining a healthy weight, and quitting use of tobacco products.  Medicines. These are given when diet and lifestyle changes have not worked. You may be prescribed a statin medicine to help lower your cholesterol levels. Follow these instructions at home: Eating and drinking  Eat a healthy, balanced diet. This diet includes: ? Daily servings of a variety of fresh, frozen, or canned fruits and vegetables. ? Daily servings of whole grain foods that are rich in fiber. ? Foods that are low in saturated fats and trans fats. These include poultry and fish without skin, lean cuts of meat, and low-fat dairy products. ? A variety of fish, especially oily fish that contain omega-3 fatty acids. Aim to eat fish at least 2 times a week.  Avoid foods and drinks that have added sugar.  Use healthy cooking methods, such as roasting, grilling, broiling, baking, poaching, steaming, and stir-frying. Do not fry your food except for stir-frying.   Lifestyle  Get regular exercise. Aim to exercise for a total of 150 minutes a week. Increase your activity level by doing activities   such as gardening, walking, and taking the stairs.  Do not use any products that contain nicotine or tobacco, such as cigarettes, e-cigarettes, and chewing tobacco. If you need help quitting, ask your health care provider.   General instructions  Take over-the-counter and prescription medicines only as told by your health care provider.  Keep all  follow-up visits as told by your health care provider. This is important. Where to find more information  American Heart Association: www.heart.org  National Heart, Lung, and Blood Institute: www.nhlbi.nih.gov Contact a health care provider if:  You have trouble achieving or maintaining a healthy diet or weight.  You are starting an exercise program.  You are unable to stop smoking. Get help right away if:  You have chest pain.  You have trouble breathing.  You have any symptoms of a stroke. "BE FAST" is an easy way to remember the main warning signs of a stroke: ? B - Balance. Signs are dizziness, sudden trouble walking, or loss of balance. ? E - Eyes. Signs are trouble seeing or a sudden change in vision. ? F - Face. Signs are sudden weakness or numbness of the face, or the face or eyelid drooping on one side. ? A - Arms. Signs are weakness or numbness in an arm. This happens suddenly and usually on one side of the body. ? S - Speech. Signs are sudden trouble speaking, slurred speech, or trouble understanding what people say. ? T - Time. Time to call emergency services. Write down what time symptoms started.  You have other signs of a stroke, such as: ? A sudden, severe headache with no known cause. ? Nausea or vomiting. ? Seizure. These symptoms may represent a serious problem that is an emergency. Do not wait to see if the symptoms will go away. Get medical help right away. Call your local emergency services (911 in the [place] Do not drive yourself to the hospital. Summary  Cholesterol plaques increase your risk for heart attack and stroke. Work with your health care provider to keep your cholesterol levels in a healthy range.  Eat a healthy, balanced diet, get regular exercise, and maintain a healthy weight.  Do not use any products that contain nicotine or tobacco, such as cigarettes, e-cigarettes, and chewing tobacco.  Get help right away if you have any symptoms of a  stroke. This information is not intended to replace advice given to you by your health care provider. Make sure you discuss any questions you have with your health care provider. Document Revised: 07/12/2019 Document Reviewed: 07/12/2019 Elsevier Patient Education  2021 [employer]. Hypertension, Adult High blood pressure (hypertension) is when the force of blood pumping through the arteries is too strong. The arteries are the blood vessels that carry blood from the heart throughout the body. Hypertension forces the heart to work harder to pump blood and may cause arteries to become narrow or stiff. Untreated or uncontrolled hypertension can cause a heart attack, heart failure, a stroke, kidney disease, and other problems. A blood pressure reading consists of a higher number over a lower number. Ideally, your blood pressure should be below 120/80. The first ("top") number is called the systolic pressure. It is a measure of the pressure in your arteries as your heart beats. The second ("bottom") number is called the diastolic pressure. It is a measure of the pressure in your arteries as the heart relaxes. What are the causes? The exact cause of this condition is not known. There are some   conditions that result in or are related to high blood pressure. What increases the risk? Some risk factors for high blood pressure are under your control. The following factors may make you more likely to develop this condition:  Smoking.  Having type 2 diabetes mellitus, high cholesterol, or both.  Not getting enough exercise or physical activity.  Being overweight.  Having too much fat, sugar, calories, or salt (sodium) in your diet.  Drinking too much alcohol. Some risk factors for high blood pressure may be difficult or impossible to change. Some of these factors include:  Having chronic kidney disease.  Having a family history of high blood pressure.  Age. Risk increases with age.  Race. You may  be at higher risk if you are African American.  Gender. Men are at higher risk than women before age 45. After age 65, women are at higher risk than men.  Having obstructive sleep apnea.  Stress. What are the signs or symptoms? High blood pressure may not cause symptoms. Very high blood pressure (hypertensive crisis) may cause:  Headache.  Anxiety.  Shortness of breath.  Nosebleed.  Nausea and vomiting.  Vision changes.  Severe chest pain.  Seizures. How is this diagnosed? This condition is diagnosed by measuring your blood pressure while you are seated, with your arm resting on a flat surface, your legs uncrossed, and your feet flat on the floor. The cuff of the blood pressure monitor will be placed directly against the skin of your upper arm at the level of your heart. It should be measured at least twice using the same arm. Certain conditions can cause a difference in blood pressure between your right and left arms. Certain factors can cause blood pressure readings to be lower or higher than normal for a short period of time:  When your blood pressure is higher when you are in a health care provider's office than when you are at home, this is called white coat hypertension. Most people with this condition do not need medicines.  When your blood pressure is higher at home than when you are in a health care provider's office, this is called masked hypertension. Most people with this condition may need medicines to control blood pressure. If you have a high blood pressure reading during one visit or you have normal blood pressure with other risk factors, you may be asked to:  Return on a different day to have your blood pressure checked again.  Monitor your blood pressure at home for 1 week or longer. If you are diagnosed with hypertension, you may have other blood or imaging tests to help your health care provider understand your overall risk for other conditions. How is this  treated? This condition is treated by making healthy lifestyle changes, such as eating healthy foods, exercising more, and reducing your alcohol intake. Your health care provider may prescribe medicine if lifestyle changes are not enough to get your blood pressure under control, and if:  Your systolic blood pressure is above 130.  Your diastolic blood pressure is above 80. Your personal target blood pressure may vary depending on your medical conditions, your age, and other factors. Follow these instructions at home: Eating and drinking  Eat a diet that is high in fiber and potassium, and low in sodium, added sugar, and fat. An example eating plan is called the DASH (Dietary Approaches to Stop Hypertension) diet. To eat this way: ? Eat plenty of fresh fruits and vegetables. Try to fill one half   of your plate at each meal with fruits and vegetables. ? Eat whole grains, such as whole-wheat pasta, brown rice, or whole-grain bread. Fill about one fourth of your plate with whole grains. ? Eat or drink low-fat dairy products, such as skim milk or low-fat yogurt. ? Avoid fatty cuts of meat, processed or cured meats, and poultry with skin. Fill about one fourth of your plate with lean proteins, such as fish, chicken without skin, beans, eggs, or tofu. ? Avoid pre-made and processed foods. These tend to be higher in sodium, added sugar, and fat.  Reduce your daily sodium intake. Most people with hypertension should eat less than 1,500 mg of sodium a day.  Do not drink alcohol if: ? Your health care provider tells you not to drink. ? You are pregnant, may be pregnant, or are planning to become pregnant.  If you drink alcohol: ? Limit how much you use to:  0-1 drink a day for women.  0-2 drinks a day for men. ? Be aware of how much alcohol is in your drink. In the [place] one drink equals one 12 oz bottle of beer (355 mL), one 5 oz glass of wine (148 mL), or one 1 oz glass of hard liquor (44 mL).    Lifestyle  Work with your health care provider to maintain a healthy body weight or to lose weight. Ask what an ideal weight is for you.  Get at least 30 minutes of exercise most days of the week. Activities may include walking, swimming, or biking.  Include exercise to strengthen your muscles (resistance exercise), such as Pilates or lifting weights, as part of your weekly exercise routine. Try to do these types of exercises for 30 minutes at least 3 days a week.  Do not use any products that contain nicotine or tobacco, such as cigarettes, e-cigarettes, and chewing tobacco. If you need help quitting, ask your health care provider.  Monitor your blood pressure at home as told by your health care provider.  Keep all follow-up visits as told by your health care provider. This is important.   Medicines  Take over-the-counter and prescription medicines only as told by your health care provider. Follow directions carefully. Blood pressure medicines must be taken as prescribed.  Do not skip doses of blood pressure medicine. Doing this puts you at risk for problems and can make the medicine less effective.  Ask your health care provider about side effects or reactions to medicines that you should watch for. Contact a health care provider if you:  Think you are having a reaction to a medicine you are taking.  Have headaches that keep coming back (recurring).  Feel dizzy.  Have swelling in your ankles.  Have trouble with your vision. Get help right away if you:  Develop a severe headache or confusion.  Have unusual weakness or numbness.  Feel faint.  Have severe pain in your chest or abdomen.  Vomit repeatedly.  Have trouble breathing. Summary  Hypertension is when the force of blood pumping through your arteries is too strong. If this condition is not controlled, it may put you at risk for serious complications.  Your personal target blood pressure may vary depending on  your medical conditions, your age, and other factors. For most people, a normal blood pressure is less than 120/80.  Hypertension is treated with lifestyle changes, medicines, or a combination of both. Lifestyle changes include losing weight, eating a healthy, low-sodium diet, exercising more, and limiting alcohol.  This information is not intended to replace advice given to you by your health care provider. Make sure you discuss any questions you have with your health care provider. Document Revised: 04/22/2018 Document Reviewed: 04/22/2018 Elsevier Patient Education  2021 [employer].

## 2021-01-19 NOTE — Discharge Summary (Signed)
Physician Discharge Summary  Murice Mann RFF:[id number] DOB: [birth year] DOA: 01/18/2021  PCP: Burnis Medin, PA-C  Admit date: 01/18/2021 Discharge date: 01/19/2021  Admitted From: Home Disposition: Home  Recommendations for Outpatient Follow-up:  1. Follow up with PCP in 1-2 weeks 2. Follow-up with cardiology as scheduled 3. Started on amlodipine 5 mg p.o. daily for hypertension 4. Started on atorvastatin 20 mg p.o. daily for atherosclerosis 5. Started on aspirin 81 mg p.o. daily  Home Health: No Equipment/Devices: None  Discharge Condition: Stable CODE STATUS: Full code Diet recommendation: Heart healthy diet  History of present illness:  [facility] is a 50 year old male with no significant past medical history who presented to [facility] ED on 5/26 following transfer from [facility] with chest pain.  Onset of symptoms 4 days prior, he ignored it thought that he worked out too hard.  Pain described as squeezing has been progressing to the point he is unable to sleep at night.  Also associated with dizziness.  Pain is reported as nonexertional as he was able to play soccer night prior for several hours without worsening in the pain.  Patient reports strong family history of myocardial infarction, brothers and uncle with events in their 6s.  In the ED, temperature 97.7 F, HR 74, RR 18, BP 156/99, SPO2 100% on room air.  Sodium 140, potassium 3.8, chloride 103, CO2 28, glucose 84, BUN 14, creatinine 0.87.  High sensitive troponin 4>2, within normal limits.  WBC 6.4, hemoglobin 13.5, platelets 250.  D-dimer 0.34.  EKG with NSR, rate 67, QTc 424 without ST elevation or depressions no T wave inversions.  Chest x-ray with no acute cardiopulmonary disease process.  CT head without contrast with no intracranial abnormality.  CT angiogram chest/aorta with normal CTA of the aorta but with advanced atheromatus coronary calcification.  Cardiology was consulted.,  [facility]  consulted for further evaluation and management of chest pain.  Hospital course:  Atypical chest pain Patient presenting to the ED with 4-day history of progressive chest discomfort.  Strong family history of early CAD.  Troponins within normal limits, chest x-ray unrevealing.  EKG not indicative of acute ischemia.  CT angiogram aorta shows advanced ASCVD.  TTE with LVEF 55-60%, LV with no regional wall motion normalities, normal diastolic parameters, trivial MR, IVC normal in size.  Cardiology was consulted to follow during hospital course.  Underwent nuclear medicine Lexiscan stress test on 01/18/2021 with no significant perfusion defects detected at rest or during stress, low risk study.  Cardiology okay for discharge home with initiation of atorvastatin and aspirin.  Outpatient follow-up with cardiology as scheduled.  Dyslipidemia Advanced atherosclerosis Advanced coronary calcification noted on CT.  Cholesterol panel with total cholesterol 145, HDL 49, LDL 81, triglycerides 74. Started atorvastatin 20 mg p.o. daily  Essential hypertension: Started on amlodipine 5 mg p.o. daily.  On aspirin and statin as well.  Continue to monitor BP and adjust antihypertensive regimen as needed.   Discharge Diagnoses:  Principal Problem:   Chest pain of uncertain etiology Active Problems:   Family history of early CAD   Dyslipidemia    Discharge Instructions  Discharge Instructions    Call MD for:  difficulty breathing, headache or visual disturbances   Complete by: As directed    Call MD for:  extreme fatigue   Complete by: As directed    Call MD for:  persistant dizziness or light-headedness   Complete by: As directed    Call MD for:  persistant nausea  and vomiting   Complete by: As directed    Call MD for:  severe uncontrolled pain   Complete by: As directed    Call MD for:  temperature >100.4   Complete by: As directed    Diet - low sodium heart healthy   Complete by: As directed     Increase activity slowly   Complete by: As directed      Allergies as of 01/19/2021   No Known Allergies     Medication List    TAKE these medications   amLODipine 5 MG tablet Commonly known as: NORVASC Take 1 tablet (5 mg total) by mouth daily. Start taking on: Jan 20, 2021   aspirin 81 MG EC tablet Take 1 tablet (81 mg total) by mouth daily. Swallow whole. Start taking on: Jan 20, 2021   atorvastatin 20 MG tablet Commonly known as: Lipitor Take 1 tablet (20 mg total) by mouth daily.   ibuprofen 200 MG tablet Commonly known as: ADVIL Take 400 mg by mouth every 6 (six) hours as needed for mild pain.   tetrahydrozoline-zinc 0.05-0.25 % ophthalmic solution Commonly known as: VISINE-AC Place 2 drops into both eyes daily as needed (dry eyes).       Follow-up Information    [facility], [place], [place]. Schedule an appointment as soon as possible for a visit in 1 week(s).   Specialty: Family Medicine Contact information: [phone] [facility] [place] [address] [phone]              No Known Allergies  Consultations:  Cardiology, Dr. Bjorn Pippin   Procedures/Studies: DG Chest 2 View  Result Date: 01/18/2021 CLINICAL DATA:  50 year old with chest pain. EXAM: CHEST - 2 VIEW COMPARISON:  None. FINDINGS: The heart size and mediastinal contours are within normal limits. Both lungs are clear. The visualized skeletal structures are unremarkable. IMPRESSION: No active cardiopulmonary disease. Electronically Signed   By: Richarda Overlie M.D.   On: 01/18/2021 07:50   CT Head Wo Contrast  Result Date: 01/18/2021 CLINICAL DATA:  Left arm numbness. EXAM: CT HEAD WITHOUT CONTRAST TECHNIQUE: Contiguous axial images were obtained from the base of the skull through the vertex without intravenous contrast. COMPARISON:  None. FINDINGS: Brain: No evidence of acute infarction, hemorrhage, hydrocephalus, extra-axial collection or mass lesion/mass effect. Vascular: No hyperdense  vessel or unexpected calcification. Skull: Normal. Negative for fracture or focal lesion. Sinuses/Orbits: No acute finding. Other: None. IMPRESSION: No intracranial abnormality seen. Electronically Signed   By: Lupita Raider M.D.   On: 01/18/2021 08:56   NM Myocar Multi W/Spect W/Wall Motion / EF  Result Date: 01/19/2021  There was no ST segment deviation noted during stress.  Nuclear stress EF: 55%. No wall motion abnormalities  The study is normal. No significant perfusion defects detected at rest or stress.  This is a low risk study.    CT ANGIO CHEST AORTA W/CM & OR WO/CM  Result Date: 01/18/2021 CLINICAL DATA:  Chest pain with aortic dissection suspected EXAM: CT ANGIOGRAPHY CHEST WITH CONTRAST TECHNIQUE: Multidetector CT imaging of the chest was performed using the standard protocol during bolus administration of intravenous contrast. Multiplanar CT image reconstructions and MIPs were obtained to evaluate the vascular anatomy. CONTRAST:  65mL OMNIPAQUE IOHEXOL 350 MG/ML SOLN COMPARISON:  None. FINDINGS: Cardiovascular: No aortic intramural hemotoma. Preferential opacification of the thoracic aorta. No evidence of thoracic aortic aneurysm or dissection. Normal heart size. No pericardial effusion. Extensive coronary calcification. Mediastinum/Nodes: Subcentimeter thyroid nodules No  followup recommended (ref: J Am Coll Radiol. 2015 Feb;12(2): 143-50). Lungs/Pleura: Lungs are clear. No pleural effusion or pneumothorax. Upper Abdomen: Negative Musculoskeletal: Negative Review of the MIP images confirms the above findings. IMPRESSION: 1. Normal CTA of the aorta. 2. Age advanced atheromatous coronary calcification. Electronically Signed   By: Marnee Spring M.D.   On: 01/18/2021 08:53   ECHOCARDIOGRAM COMPLETE  Result Date: 01/19/2021    ECHOCARDIOGRAM REPORT   Patient Name:   DOYCE SALING Date of Exam: 01/19/2021 Medical Rec #:  [id number]      Height:       70.0 in Accession #:    [id number]      Weight:       168.8 lb Date of Birth:  Nov 09, 1970     BSA:          1.942 m Patient Age:    49 years       BP:           115/79 mmHg Patient Gender: M              HR:           59 bpm. Exam Location:  Inpatient Procedure: 2D Echo, Cardiac Doppler and Color Doppler Indications:    Chest pain  History:        Patient has no prior history of Echocardiogram examinations.  Sonographer:    Neomia Dear RDCS Referring Phys: [id number] ANGELA NICOLE DUKE IMPRESSIONS  1. Left ventricular ejection fraction, by estimation, is 55 to 60%. The left ventricle has normal function. The left ventricle has no regional wall motion abnormalities. Left ventricular diastolic parameters were normal.  2. Right ventricular systolic function is normal. The right ventricular size is normal. There is normal pulmonary artery systolic pressure. The estimated right ventricular systolic pressure is 27.6 mmHg.  3. The mitral valve is grossly normal. Trivial mitral valve regurgitation. No evidence of mitral stenosis.  4. The aortic valve is tricuspid. Aortic valve regurgitation is not visualized. No aortic stenosis is present.  5. The inferior vena cava is normal in size with greater than 50% respiratory variability, suggesting right atrial pressure of 3 mmHg. FINDINGS  Left Ventricle: Left ventricular ejection fraction, by estimation, is 55 to 60%. The left ventricle has normal function. The left ventricle has no regional wall motion abnormalities. The left ventricular internal cavity size was normal in size. There is  no left ventricular hypertrophy. Left ventricular diastolic parameters were normal. Right Ventricle: The right ventricular size is normal. No increase in right ventricular wall thickness. Right ventricular systolic function is normal. There is normal pulmonary artery systolic pressure. The tricuspid regurgitant velocity is 2.48 m/s, and  with an assumed right atrial pressure of 3 mmHg, the estimated right ventricular systolic pressure  is 27.6 mmHg. Left Atrium: Left atrial size was normal in size. Right Atrium: Right atrial size was normal in size. Pericardium: Trivial pericardial effusion is present. Mitral Valve: The mitral valve is grossly normal. Trivial mitral valve regurgitation. No evidence of mitral valve stenosis. MV peak gradient, 2.6 mmHg. The mean mitral valve gradient is 1.0 mmHg. Tricuspid Valve: The tricuspid valve is grossly normal. Tricuspid valve regurgitation is trivial. No evidence of tricuspid stenosis. Aortic Valve: The aortic valve is tricuspid. Aortic valve regurgitation is not visualized. No aortic stenosis is present. Aortic valve mean gradient measures 4.0 mmHg. Aortic valve peak gradient measures 7.2 mmHg. Aortic valve area, by VTI measures 4.46 cm. Pulmonic Valve: The pulmonic valve was grossly normal.  Pulmonic valve regurgitation is not visualized. No evidence of pulmonic stenosis. Aorta: The aortic root and ascending aorta are structurally normal, with no evidence of dilitation. Venous: The inferior vena cava is normal in size with greater than 50% respiratory variability, suggesting right atrial pressure of 3 mmHg. IAS/Shunts: The atrial septum is grossly normal.  LEFT VENTRICLE PLAX 2D LVIDd:         5.00 cm     Diastology LVIDs:         3.20 cm     LV e' medial:    9.14 cm/s LV PW:         0.80 cm     LV E/e' medial:  6.6 LV IVS:        0.90 cm     LV e' lateral:   14.60 cm/s LVOT diam:     2.50 cm     LV E/e' lateral: 4.1 LV SV:         126 LV SV Index:   65 LVOT Area:     4.91 cm  LV Volumes (MOD) LV vol d, MOD A2C: 61.9 ml LV vol d, MOD A4C: 99.9 ml LV vol s, MOD A2C: 34.8 ml LV vol s, MOD A4C: 46.0 ml LV SV MOD A2C:     27.1 ml LV SV MOD A4C:     99.9 ml LV SV MOD BP:      40.4 ml RIGHT VENTRICLE RV Basal diam:  3.70 cm RV Mid diam:    3.10 cm RV S prime:     12.10 cm/s TAPSE (M-mode): 1.9 cm LEFT ATRIUM             Index       RIGHT ATRIUM           Index LA diam:        2.90 cm 1.49 cm/m  RA Area:      13.00 cm LA Vol (A2C):   60.0 ml 30.89 ml/m RA Volume:   31.00 ml  15.96 ml/m LA Vol (A4C):   53.2 ml 27.39 ml/m LA Biplane Vol: 56.5 ml 29.09 ml/m  AORTIC VALVE                   PULMONIC VALVE AV Area (Vmax):    4.54 cm    PV Vmax:       0.84 m/s AV Area (Vmean):   4.40 cm    PV Vmean:      57.400 cm/s AV Area (VTI):     4.46 cm    PV VTI:        0.183 m AV Vmax:           134.00 cm/s PV Peak grad:  2.8 mmHg AV Vmean:          90.500 cm/s PV Mean grad:  2.0 mmHg AV VTI:            0.283 m AV Peak Grad:      7.2 mmHg AV Mean Grad:      4.0 mmHg LVOT Vmax:         124.00 cm/s LVOT Vmean:        81.200 cm/s LVOT VTI:          0.257 m LVOT/AV VTI ratio: 0.91  AORTA Ao Root diam: 3.50 cm Ao Asc diam:  2.90 cm MITRAL VALVE               TRICUSPID VALVE MV Area (PHT):  2.79 cm    TR Peak grad:   24.6 mmHg MV Area VTI:   4.87 cm    TR Vmax:        248.00 cm/s MV Peak grad:  2.6 mmHg MV Mean grad:  1.0 mmHg    SHUNTS MV Vmax:       0.81 m/s    Systemic VTI:  0.26 m MV Vmean:      47.4 cm/s   Systemic Diam: 2.50 cm MV Decel Time: 272 msec MR Peak grad: 94.5 mmHg MR Vmax:      486.00 cm/s MV E velocity: 60.10 cm/s MV A velocity: 76.20 cm/s MV E/A ratio:  0.79 Lennie OdorWesley O'Neal MD Electronically signed by Lennie OdorWesley O'Neal MD Signature Date/Time: 01/19/2021/11:05:24 AM    Final       Subjective: Patient seen and examined at bedside, resting comfortably.  Just returned back from nuclear medicine stress test which was unrevealing.  Okay for discharge home per cardiology with initiation of amlodipine, atorvastatin and aspirin.  No other questions or concerns.  Denies headache, no current chest pain, no palpitations, no shortness of breath, no abdominal pain.  No acute events overnight per nursing staff.  Discharge Exam: Vitals:   01/19/21 1334 01/19/21 1336  BP: (!) 167/84 (!) 160/82  Pulse: (!) 110 95  Resp:    Temp:    SpO2:     Vitals:   01/19/21 1326 01/19/21 1331 01/19/21 1334 01/19/21 1336  BP: (!)  150/85 (!) 143/95 (!) 167/84 (!) 160/82  Pulse: 61 (!) 122 (!) 110 95  Resp:      Temp:      TempSrc:      SpO2:      Weight:      Height:        General: Pt is alert, awake, not in acute distress Cardiovascular: RRR, S1/S2 +, no rubs, no gallops Respiratory: CTA bilaterally, no wheezing, no rhonchi Abdominal: Soft, NT, ND, bowel sounds + Extremities: no edema, no cyanosis    The results of significant diagnostics from this hospitalization (including imaging, microbiology, ancillary and laboratory) are listed below for reference.     Microbiology: Recent Results (from the past 240 hour(s))  Resp Panel by RT-PCR (Flu A&B, Covid) Nasopharyngeal Swab     Status: None   Collection Time: 01/18/21 10:52 AM   Specimen: Nasopharyngeal Swab; Nasopharyngeal(NP) swabs in vial transport medium  Result Value Ref Range Status   SARS Coronavirus 2 by RT PCR NEGATIVE NEGATIVE Final    Comment: (NOTE) SARS-CoV-2 target nucleic acids are NOT DETECTED.  The SARS-CoV-2 RNA is generally detectable in upper respiratory specimens during the acute phase of infection. The lowest concentration of SARS-CoV-2 viral copies this assay can detect is 138 copies/mL. A negative result does not preclude SARS-Cov-2 infection and should not be used as the sole basis for treatment or other patient management decisions. A negative result may occur with  improper specimen collection/handling, submission of specimen other than nasopharyngeal swab, presence of viral mutation(s) within the areas targeted by this assay, and inadequate number of viral copies(<138 copies/mL). A negative result must be combined with clinical observations, patient history, and epidemiological information. The expected result is Negative.  Fact Sheet for Patients:  BloggerCourse.comhttps://www.fda.gov/media/152166/download  Fact Sheet for Healthcare Providers:  SeriousBroker.ithttps://www.fda.gov/media/152162/download  This test is no t yet approved or cleared by  the [place] FDA and  has been authorized for detection and/or diagnosis of SARS-CoV-2 by FDA under an Emergency Use Authorization (EUA).  This EUA will remain  in effect (meaning this test can be used) for the duration of the COVID-19 declaration under Section 564(b)(1) of the Act, 21 U.S.C.section 360bbb-3(b)(1), unless the authorization is terminated  or revoked sooner.       Influenza A by PCR NEGATIVE NEGATIVE Final   Influenza B by PCR NEGATIVE NEGATIVE Final    Comment: (NOTE) The Xpert Xpress SARS-CoV-2/FLU/RSV plus assay is intended as an aid in the diagnosis of influenza from Nasopharyngeal swab specimens and should not be used as a sole basis for treatment. Nasal washings and aspirates are unacceptable for Xpert Xpress SARS-CoV-2/FLU/RSV testing.  Fact Sheet for Patients: BloggerCourse.com  Fact Sheet for Healthcare Providers: SeriousBroker.it  This test is not yet approved or cleared by the [place] FDA and has been authorized for detection and/or diagnosis of SARS-CoV-2 by FDA under an Emergency Use Authorization (EUA). This EUA will remain in effect (meaning this test can be used) for the duration of the COVID-19 declaration under Section 564(b)(1) of the Act, 21 U.S.C. section 360bbb-3(b)(1), unless the authorization is terminated or revoked.  Performed at [facility], [address] [place], [place] [address]      Labs: BNP (last 3 results) No results for input(s): BNP in the last 8760 hours. Basic Metabolic Panel: Recent Labs  Lab 01/18/21 0641  NA 140  K 3.8  CL 103  CO2 28  GLUCOSE 84  BUN 14  CREATININE 0.87  CALCIUM 8.8*   Liver Function Tests: No results for input(s): AST, ALT, ALKPHOS, BILITOT, PROT, ALBUMIN in the last 168 hours. No results for input(s): LIPASE, AMYLASE in the last 168 hours. No results for input(s): AMMONIA in the last 168  hours. CBC: Recent Labs  Lab 01/18/21 0641  WBC 6.4  NEUTROABS 3.9  HGB 13.5  HCT 40.4  MCV 91.4  PLT 250   Cardiac Enzymes: No results for input(s): CKTOTAL, CKMB, CKMBINDEX, TROPONINI in the last 168 hours. BNP: Invalid input(s): POCBNP CBG: No results for input(s): GLUCAP in the last 168 hours. D-Dimer Recent Labs    01/18/21 0732  DDIMER 0.34   Hgb A1c No results for input(s): HGBA1C in the last 72 hours. Lipid Profile Recent Labs    01/19/21 0503  CHOL 145  HDL 49  LDLCALC 81  TRIG 74  CHOLHDL 3.0   Thyroid function studies No results for input(s): TSH, T4TOTAL, T3FREE, THYROIDAB in the last 72 hours.  Invalid input(s): FREET3 Anemia work up No results for input(s): VITAMINB12, FOLATE, FERRITIN, TIBC, IRON, RETICCTPCT in the last 72 hours. Urinalysis No results found for: COLORURINE, APPEARANCEUR, LABSPEC, PHURINE, GLUCOSEU, HGBUR, BILIRUBINUR, KETONESUR, PROTEINUR, UROBILINOGEN, NITRITE, LEUKOCYTESUR Sepsis Labs Invalid input(s): PROCALCITONIN,  WBC,  LACTICIDVEN Microbiology Recent Results (from the past 240 hour(s))  Resp Panel by RT-PCR (Flu A&B, Covid) Nasopharyngeal Swab     Status: None   Collection Time: 01/18/21 10:52 AM   Specimen: Nasopharyngeal Swab; Nasopharyngeal(NP) swabs in vial transport medium  Result Value Ref Range Status   SARS Coronavirus 2 by RT PCR NEGATIVE NEGATIVE Final    Comment: (NOTE) SARS-CoV-2 target nucleic acids are NOT DETECTED.  The SARS-CoV-2 RNA is generally detectable in upper respiratory specimens during the acute phase of infection. The lowest concentration of SARS-CoV-2 viral copies this assay can detect is 138 copies/mL. A negative result does not preclude SARS-Cov-2 infection and should not be used as the sole basis for treatment or other patient management decisions. A negative result may occur with  improper specimen collection/handling, submission of specimen other than nasopharyngeal swab, presence of  viral mutation(s) within the areas targeted by this assay, and inadequate number of viral copies(<138 copies/mL). A negative result must be combined with clinical observations, patient history, and epidemiological information. The expected result is Negative.  Fact Sheet for Patients:  BloggerCourse.com  Fact Sheet for Healthcare Providers:  SeriousBroker.it  This test is no t yet approved or cleared by the [place] FDA and  has been authorized for detection and/or diagnosis of SARS-CoV-2 by FDA under an Emergency Use Authorization (EUA). This EUA will remain  in effect (meaning this test can be used) for the duration of the COVID-19 declaration under Section 564(b)(1) of the Act, 21 U.S.C.section 360bbb-3(b)(1), unless the authorization is terminated  or revoked sooner.       Influenza A by PCR NEGATIVE NEGATIVE Final   Influenza B by PCR NEGATIVE NEGATIVE Final    Comment: (NOTE) The Xpert Xpress SARS-CoV-2/FLU/RSV plus assay is intended as an aid in the diagnosis of influenza from Nasopharyngeal swab specimens and should not be used as a sole basis for treatment. Nasal washings and aspirates are unacceptable for Xpert Xpress SARS-CoV-2/FLU/RSV testing.  Fact Sheet for Patients: BloggerCourse.com  Fact Sheet for Healthcare Providers: SeriousBroker.it  This test is not yet approved or cleared by the [place] FDA and has been authorized for detection and/or diagnosis of SARS-CoV-2 by FDA under an Emergency Use Authorization (EUA). This EUA will remain in effect (meaning this test can be used) for the duration of the COVID-19 declaration under Section 564(b)(1) of the Act, 21 U.S.C. section 360bbb-3(b)(1), unless the authorization is terminated or revoked.  Performed at [facility], [address], [place], [place] [address]      Time  coordinating discharge: Over 30 minutes  SIGNED:   Alvira Philips [place], DO  [facility] 01/19/2021, 4:01 PM

## 2021-01-19 NOTE — Progress Notes (Signed)
PROGRESS NOTE    Chritopher Coster  ZOX:[id number] DOB: 1971-06-13 DOA: 01/18/2021 PCP: Burnis Medin, PA-C    Brief Narrative:  Steven Mann is a 50 year old male with no significant past medical history who presented to [facility], ED on 5/26 following transfer from [facility] with chest pain.  Onset of symptoms 4 days prior, he ignored it thought that he worked out too hard.  Pain described as squeezing has been progressing to the point he is unable to sleep at night.  Also associated with dizziness.  Pain is reported as nonexertional as he was able to play soccer night prior for several hours without worsening in the pain.  Patient reports strong family history of myocardial infarction, brothers and uncle with events in their 22s.  In the ED, temperature 97.7 F, HR 74, RR 18, BP 156/99, SPO2 100% on room air.  Sodium 140, potassium 3.8, chloride 103, CO2 28, glucose 84, BUN 14, creatinine 0.87.  High sensitive troponin 4>2, within normal limits.  WBC 6.4, hemoglobin 13.5, platelets 250.  D-dimer 0.34.  EKG with NSR, rate 67, QTc 424 without ST elevation or depressions no T wave inversions.  Chest x-ray with no acute cardiopulmonary disease process.  CT head without contrast with no intracranial abnormality.  CT angiogram chest/aorta with normal CTA of the aorta but with advanced atheromatus coronary calcification.  Cardiology was consulted.,  [facility] consulted for further evaluation and management of chest pain.   Assessment & Plan:   Principal Problem:   Chest pain of uncertain etiology Active Problems:   Family history of early CAD   Dyslipidemia   Chest pain Patient presenting to the ED with 4-day history of progressive chest discomfort.  Strong family history of early CAD.  Troponins within normal limits, chest x-ray unrevealing.  EKG not indicative of acute ischemia.  CT angiogram aorta shows advanced ASCVD.  TTE with LVEF 55-60%, LV with no regional wall motion  normalities, normal diastolic parameters, trivial MR, IVC normal in size. -- Cardiology following, appreciate assistance -- Lexiscan Myoview stress test: Pending --Started on aspirin and statin --Continue monitor on telemetry  Dyslipidemia Advanced atherosclerosis Advanced coronary calcification noted on CT.  Cholesterol panel with total cholesterol 145, HDL 49, LDL 81, triglycerides 74. --Started atorvastatin 20 mg p.o. daily  Essential hypertension: --Amlodipine 5 mg p.o. daily --Aspirin and statin    DVT prophylaxis: Lovenox   Code Status: Full Code Family Communication: No family present at bedside this morning  Disposition Plan:  Level of care: Telemetry Medical Status is: Observation  The patient remains OBS appropriate and will d/c before 2 midnights.  Dispo: The patient is from: Home              Anticipated d/c is to: Home              Patient currently is not medically stable to d/c.   Difficult to place patient No   Consultants:   Cardiology  Procedures:   TTE: Pending  Lexiscan Myoview stress test: Pending  Antimicrobials:   None   Subjective: Patient seen examined at bedside, resting comfortably.  Continues with mild substernal chest discomfort reported as 1 out of 10, much improved since yesterday.  Awaiting stress test today.  No other questions or concerns at this time.  No family present at bedside.  Patient denies headache, no fever/chills/night sweats, no cough/congestion, no shortness of breath, no abdominal pain, no palpitations, no weakness, no fatigue, no paresthesias.  No acute events  overnight per nursing staff.  Objective: Vitals:   01/18/21 2034 01/18/21 2354 01/19/21 0407 01/19/21 0935  BP: (!) 136/92 131/83 115/79 129/86  Pulse: 64 (!) 53 60 61  Resp: 15 19 16 16   Temp: 98.4 F (36.9 C)  98.6 F (37 C) 98.5 F (36.9 C)  TempSrc: Oral  Oral Oral  SpO2:  98%    Weight:      Height:       No intake or output data in the 24  hours ending 01/19/21 1229 Filed Weights   01/18/21 0635 01/18/21 1535  Weight: 77.1 kg 76.6 kg    Examination:  General exam: Appears calm and comfortable  Respiratory system: Clear to auscultation. Respiratory effort normal.  On room air Cardiovascular system: S1 & S2 heard, RRR. No JVD, murmurs, rubs, gallops or clicks. No pedal edema. Gastrointestinal system: Abdomen is nondistended, soft and nontender. No organomegaly or masses felt. Normal bowel sounds heard. Central nervous system: Alert and oriented. No focal neurological deficits. Extremities: Symmetric 5 x 5 power. Skin: No rashes, lesions or ulcers Psychiatry: Judgement and insight appear normal. Mood & affect appropriate.     Data Reviewed: I have personally reviewed following labs and imaging studies  CBC: Recent Labs  Lab 01/18/21 0641  WBC 6.4  NEUTROABS 3.9  HGB 13.5  HCT 40.4  MCV 91.4  PLT 250   Basic Metabolic Panel: Recent Labs  Lab 01/18/21 0641  NA 140  K 3.8  CL 103  CO2 28  GLUCOSE 84  BUN 14  CREATININE 0.87  CALCIUM 8.8*   GFR: Estimated Creatinine Clearance: 104.9 mL/min (by C-G formula based on SCr of 0.87 mg/dL). Liver Function Tests: No results for input(s): AST, ALT, ALKPHOS, BILITOT, PROT, ALBUMIN in the last 168 hours. No results for input(s): LIPASE, AMYLASE in the last 168 hours. No results for input(s): AMMONIA in the last 168 hours. Coagulation Profile: No results for input(s): INR, PROTIME in the last 168 hours. Cardiac Enzymes: No results for input(s): CKTOTAL, CKMB, CKMBINDEX, TROPONINI in the last 168 hours. BNP (last 3 results) No results for input(s): PROBNP in the last 8760 hours. HbA1C: No results for input(s): HGBA1C in the last 72 hours. CBG: No results for input(s): GLUCAP in the last 168 hours. Lipid Profile: Recent Labs    01/19/21 0503  CHOL 145  HDL 49  LDLCALC 81  TRIG 74  CHOLHDL 3.0   Thyroid Function Tests: No results for input(s): TSH,  T4TOTAL, FREET4, T3FREE, THYROIDAB in the last 72 hours. Anemia Panel: No results for input(s): VITAMINB12, FOLATE, FERRITIN, TIBC, IRON, RETICCTPCT in the last 72 hours. Sepsis Labs: No results for input(s): PROCALCITON, LATICACIDVEN in the last 168 hours.  Recent Results (from the past 240 hour(s))  Resp Panel by RT-PCR (Flu A&B, Covid) Nasopharyngeal Swab     Status: None   Collection Time: 01/18/21 10:52 AM   Specimen: Nasopharyngeal Swab; Nasopharyngeal(NP) swabs in vial transport medium  Result Value Ref Range Status   SARS Coronavirus 2 by RT PCR NEGATIVE NEGATIVE Final    Comment: (NOTE) SARS-CoV-2 target nucleic acids are NOT DETECTED.  The SARS-CoV-2 RNA is generally detectable in upper respiratory specimens during the acute phase of infection. The lowest concentration of SARS-CoV-2 viral copies this assay can detect is 138 copies/mL. A negative result does not preclude SARS-Cov-2 infection and should not be used as the sole basis for treatment or other patient management decisions. A negative result may occur with  improper specimen  collection/handling, submission of specimen other than nasopharyngeal swab, presence of viral mutation(s) within the areas targeted by this assay, and inadequate number of viral copies(<138 copies/mL). A negative result must be combined with clinical observations, patient history, and epidemiological information. The expected result is Negative.  Fact Sheet for Patients:  BloggerCourse.comhttps://www.fda.gov/media/152166/download  Fact Sheet for Healthcare Providers:  SeriousBroker.ithttps://www.fda.gov/media/152162/download  This test is no t yet approved or cleared by the [place] FDA and  has been authorized for detection and/or diagnosis of SARS-CoV-2 by FDA under an Emergency Use Authorization (EUA). This EUA will remain  in effect (meaning this test can be used) for the duration of the COVID-19 declaration under Section 564(b)(1) of the Act, 21 U.S.C.section  360bbb-3(b)(1), unless the authorization is terminated  or revoked sooner.       Influenza A by PCR NEGATIVE NEGATIVE Final   Influenza B by PCR NEGATIVE NEGATIVE Final    Comment: (NOTE) The Xpert Xpress SARS-CoV-2/FLU/RSV plus assay is intended as an aid in the diagnosis of influenza from Nasopharyngeal swab specimens and should not be used as a sole basis for treatment. Nasal washings and aspirates are unacceptable for Xpert Xpress SARS-CoV-2/FLU/RSV testing.  Fact Sheet for Patients: BloggerCourse.comhttps://www.fda.gov/media/152166/download  Fact Sheet for Healthcare Providers: SeriousBroker.ithttps://www.fda.gov/media/152162/download  This test is not yet approved or cleared by the [place] FDA and has been authorized for detection and/or diagnosis of SARS-CoV-2 by FDA under an Emergency Use Authorization (EUA). This EUA will remain in effect (meaning this test can be used) for the duration of the COVID-19 declaration under Section 564(b)(1) of the Act, 21 U.S.C. section 360bbb-3(b)(1), unless the authorization is terminated or revoked.  Performed at [facility], [address], [place], [place] [id number]          Radiology Studies: DG Chest 2 View  Result Date: 01/18/2021 CLINICAL DATA:  50 year old with chest pain. EXAM: CHEST - 2 VIEW COMPARISON:  None. FINDINGS: The heart size and mediastinal contours are within normal limits. Both lungs are clear. The visualized skeletal structures are unremarkable. IMPRESSION: No active cardiopulmonary disease. Electronically Signed   By: Richarda OverlieAdam  Henn M.D.   On: 01/18/2021 07:50   CT Head Wo Contrast  Result Date: 01/18/2021 CLINICAL DATA:  Left arm numbness. EXAM: CT HEAD WITHOUT CONTRAST TECHNIQUE: Contiguous axial images were obtained from the base of the skull through the vertex without intravenous contrast. COMPARISON:  None. FINDINGS: Brain: No evidence of acute infarction, hemorrhage, hydrocephalus, extra-axial collection or mass  lesion/mass effect. Vascular: No hyperdense vessel or unexpected calcification. Skull: Normal. Negative for fracture or focal lesion. Sinuses/Orbits: No acute finding. Other: None. IMPRESSION: No intracranial abnormality seen. Electronically Signed   By: Lupita RaiderJames  Green Jr M.D.   On: 01/18/2021 08:56   CT ANGIO CHEST AORTA W/CM & OR WO/CM  Result Date: 01/18/2021 CLINICAL DATA:  Chest pain with aortic dissection suspected EXAM: CT ANGIOGRAPHY CHEST WITH CONTRAST TECHNIQUE: Multidetector CT imaging of the chest was performed using the standard protocol during bolus administration of intravenous contrast. Multiplanar CT image reconstructions and MIPs were obtained to evaluate the vascular anatomy. CONTRAST:  75mL OMNIPAQUE IOHEXOL 350 MG/ML SOLN COMPARISON:  None. FINDINGS: Cardiovascular: No aortic intramural hemotoma. Preferential opacification of the thoracic aorta. No evidence of thoracic aortic aneurysm or dissection. Normal heart size. No pericardial effusion. Extensive coronary calcification. Mediastinum/Nodes: Subcentimeter thyroid nodules No followup recommended (ref: J Am Coll Radiol. 2015 Feb;12(2): 143-50). Lungs/Pleura: Lungs are clear. No pleural effusion or pneumothorax. Upper Abdomen: Negative Musculoskeletal: Negative Review  of the MIP images confirms the above findings. IMPRESSION: 1. Normal CTA of the aorta. 2. Age advanced atheromatous coronary calcification. Electronically Signed   By: Marnee Spring M.D.   On: 01/18/2021 08:53   ECHOCARDIOGRAM COMPLETE  Result Date: 01/19/2021    ECHOCARDIOGRAM REPORT   Patient Name:   CRISTAL HOWATT Date of Exam: 01/19/2021 Medical Rec #:  [id number]      Height:       70.0 in Accession #:    [id number]     Weight:       168.8 lb Date of Birth:  01/03/1971     BSA:          1.942 m Patient Age:    49 years       BP:           115/79 mmHg Patient Gender: M              HR:           59 bpm. Exam Location:  Inpatient Procedure: 2D Echo, Cardiac Doppler and  Color Doppler Indications:    Chest pain  History:        Patient has no prior history of Echocardiogram examinations.  Sonographer:    Neomia Dear RDCS Referring Phys: [id number] ANGELA NICOLE DUKE IMPRESSIONS  1. Left ventricular ejection fraction, by estimation, is 55 to 60%. The left ventricle has normal function. The left ventricle has no regional wall motion abnormalities. Left ventricular diastolic parameters were normal.  2. Right ventricular systolic function is normal. The right ventricular size is normal. There is normal pulmonary artery systolic pressure. The estimated right ventricular systolic pressure is 27.6 mmHg.  3. The mitral valve is grossly normal. Trivial mitral valve regurgitation. No evidence of mitral stenosis.  4. The aortic valve is tricuspid. Aortic valve regurgitation is not visualized. No aortic stenosis is present.  5. The inferior vena cava is normal in size with greater than 50% respiratory variability, suggesting right atrial pressure of 3 mmHg. FINDINGS  Left Ventricle: Left ventricular ejection fraction, by estimation, is 55 to 60%. The left ventricle has normal function. The left ventricle has no regional wall motion abnormalities. The left ventricular internal cavity size was normal in size. There is  no left ventricular hypertrophy. Left ventricular diastolic parameters were normal. Right Ventricle: The right ventricular size is normal. No increase in right ventricular wall thickness. Right ventricular systolic function is normal. There is normal pulmonary artery systolic pressure. The tricuspid regurgitant velocity is 2.48 m/s, and  with an assumed right atrial pressure of 3 mmHg, the estimated right ventricular systolic pressure is 27.6 mmHg. Left Atrium: Left atrial size was normal in size. Right Atrium: Right atrial size was normal in size. Pericardium: Trivial pericardial effusion is present. Mitral Valve: The mitral valve is grossly normal. Trivial mitral valve  regurgitation. No evidence of mitral valve stenosis. MV peak gradient, 2.6 mmHg. The mean mitral valve gradient is 1.0 mmHg. Tricuspid Valve: The tricuspid valve is grossly normal. Tricuspid valve regurgitation is trivial. No evidence of tricuspid stenosis. Aortic Valve: The aortic valve is tricuspid. Aortic valve regurgitation is not visualized. No aortic stenosis is present. Aortic valve mean gradient measures 4.0 mmHg. Aortic valve peak gradient measures 7.2 mmHg. Aortic valve area, by VTI measures 4.46 cm. Pulmonic Valve: The pulmonic valve was grossly normal. Pulmonic valve regurgitation is not visualized. No evidence of pulmonic stenosis. Aorta: The aortic root and ascending aorta are structurally normal, with no evidence of  dilitation. Venous: The inferior vena cava is normal in size with greater than 50% respiratory variability, suggesting right atrial pressure of 3 mmHg. IAS/Shunts: The atrial septum is grossly normal.  LEFT VENTRICLE PLAX 2D LVIDd:         5.00 cm     Diastology LVIDs:         3.20 cm     LV e' medial:    9.14 cm/s LV PW:         0.80 cm     LV E/e' medial:  6.6 LV IVS:        0.90 cm     LV e' lateral:   14.60 cm/s LVOT diam:     2.50 cm     LV E/e' lateral: 4.1 LV SV:         126 LV SV Index:   65 LVOT Area:     4.91 cm  LV Volumes (MOD) LV vol d, MOD A2C: 61.9 ml LV vol d, MOD A4C: 99.9 ml LV vol s, MOD A2C: 34.8 ml LV vol s, MOD A4C: 46.0 ml LV SV MOD A2C:     27.1 ml LV SV MOD A4C:     99.9 ml LV SV MOD BP:      40.4 ml RIGHT VENTRICLE RV Basal diam:  3.70 cm RV Mid diam:    3.10 cm RV S prime:     12.10 cm/s TAPSE (M-mode): 1.9 cm LEFT ATRIUM             Index       RIGHT ATRIUM           Index LA diam:        2.90 cm 1.49 cm/m  RA Area:     13.00 cm LA Vol (A2C):   60.0 ml 30.89 ml/m RA Volume:   31.00 ml  15.96 ml/m LA Vol (A4C):   53.2 ml 27.39 ml/m LA Biplane Vol: 56.5 ml 29.09 ml/m  AORTIC VALVE                   PULMONIC VALVE AV Area (Vmax):    4.54 cm    PV Vmax:        0.84 m/s AV Area (Vmean):   4.40 cm    PV Vmean:      57.400 cm/s AV Area (VTI):     4.46 cm    PV VTI:        0.183 m AV Vmax:           134.00 cm/s PV Peak grad:  2.8 mmHg AV Vmean:          90.500 cm/s PV Mean grad:  2.0 mmHg AV VTI:            0.283 m AV Peak Grad:      7.2 mmHg AV Mean Grad:      4.0 mmHg LVOT Vmax:         124.00 cm/s LVOT Vmean:        81.200 cm/s LVOT VTI:          0.257 m LVOT/AV VTI ratio: 0.91  AORTA Ao Root diam: 3.50 cm Ao Asc diam:  2.90 cm MITRAL VALVE               TRICUSPID VALVE MV Area (PHT): 2.79 cm    TR Peak grad:   24.6 mmHg MV Area VTI:   4.87 cm    TR Vmax:  248.00 cm/s MV Peak grad:  2.6 mmHg MV Mean grad:  1.0 mmHg    SHUNTS MV Vmax:       0.81 m/s    Systemic VTI:  0.26 m MV Vmean:      47.4 cm/s   Systemic Diam: 2.50 cm MV Decel Time: 272 msec MR Peak grad: 94.5 mmHg MR Vmax:      486.00 cm/s MV E velocity: 60.10 cm/s MV A velocity: 76.20 cm/s MV E/A ratio:  0.79 Lennie Odor MD Electronically signed by Lennie Odor MD Signature Date/Time: 01/19/2021/11:05:24 AM    Final         Scheduled Meds: . amLODipine  5 mg Oral Daily  . aspirin EC  81 mg Oral Daily  . atorvastatin  20 mg Oral Daily  . enoxaparin (LOVENOX) injection  40 mg Subcutaneous Q24H   Continuous Infusions:   LOS: 0 days    Time spent: 38 minutes spent on chart review, discussion with nursing staff, consultants, updating family and interview/physical exam; more than 50% of that time was spent in counseling and/or coordination of care.    Alvira Philips [place], DO [facility] Available via Epic secure chat 7am-7pm After these hours, please refer to coverage provider listed on [email or web address] 01/19/2021, 12:29 PM

## 2021-01-25 ENCOUNTER — Telehealth: Payer: Self-pay | Admitting: Cardiology

## 2021-01-25 NOTE — Telephone Encounter (Signed)
Patient has a hospital follow up scheduled for 01/29/21 with Dr. Bjorn Pippin, but he is requesting to speak with someone prior to this appointment regarding his symptoms. Per patient schedule message:  Unfortunately we leave for [place] on June 14th.  Part of the concern is, especially at night, I have discomfort or 2/10 pain, on the left side of my chest, shoulder and neck.  It feels better in the mornings (I take the new medications as soon as I wake up), but in the mornings to afternoons I feel weak and lightheaded.  Prior to the event of May 26th/27th I did not have any of these symptoms.  What brought me to the ER was left chest pain, numbness left  shoulder, elbow and hand.  I am just concerned, and wanted to talk to a Medical professional about how I can resume my life, can I lift weights?  Can I play soccer?  Can I do high intensity interval training?  Should I be worried about my June 14th [place] trip?  5 cities 2 weeks and alot of walking each day.  thank you in advance for any answers you can provide  Regards,  Steven Mann"

## 2021-01-25 NOTE — Telephone Encounter (Signed)
Returned call to patient left message on personal voice mail to call back. 

## 2021-01-26 ENCOUNTER — Telehealth: Payer: Self-pay | Admitting: Cardiology

## 2021-01-26 NOTE — Telephone Encounter (Signed)
Pt c/o medication issue:  1. Name of Medications: atorvastatin (LIPITOR) 20 MG tablet and amLODipine (NORVASC) 5 MG tablet  2. How are you currently taking this medication (dosage and times per day)? As directed   3. Are you having a reaction (difficulty breathing--STAT)? Occasional Chest Pain and Lightheadedness  4. What is your medication issue? Patient is concerned about the side effects of this medication.  He has some lightheadedness and chest pain on his left side.  The lightheadedness and dizziness starts in the morning and subsides as the day goes on The chest pain usually starts at night and subsides as the night goes on   Pt c/o of Chest Pain: STAT if CP now or developed within 24 hours  1. Are you having CP right now? no  2. Are you experiencing any other symptoms (ex. SOB, nausea, vomiting, sweating)? no  3. How long have you been experiencing CP? Started 01/18/21. Patient went to ER at this time   4. Is your CP continuous or coming and going? Patient states it is more on the outside of the pectoral muscle and it feels like someone is poking him with two fingers. It starts off dull and when it is at its strongest he wakes up in the middle of the night, It resolves itself and he can go back to sleep  5. Have you taken Nitroglycerin? no ? STAT if patient feels like he/she is going to faint   1) Are you dizzy now? Not now, he has been awake since 6:30 and the symptoms are starting to subside   2) Do you feel faint or have you passed out? no  3) Do you have any other symptoms? no  4) Have you checked your HR and BP (record if available)?     01/21/21   7:42 pm: 118/78 HR 69  01/22/21 10:15 am: 113/70 HR 66  01/23/21   9:48 am: 117/75 HR 73   01/25/21 10:30 pm: 114/73 HR 74  01/26/21   3:05 am: 112/67 HR 63  Patient felt like symptoms were resolving when he called. It has happened 3 nights in a row and wanted to get advice from Dr. Bjorn Pippin

## 2021-01-26 NOTE — Telephone Encounter (Signed)
Called patient, gave recommendations.  Patient verbalized understanding.

## 2021-01-26 NOTE — Telephone Encounter (Signed)
BP looks good, no changes at this time

## 2021-01-26 NOTE — Telephone Encounter (Signed)
Duplicate. See other note.

## 2021-01-26 NOTE — Telephone Encounter (Signed)
Returned the call to the patient. He stated that he went to the ED on 01/18/21 with complaints of chest pain. Stress test and echo were normal.   He was started on amlodipine and atorvastatin.  Blood pressure readings:             01/21/21   7:42 pm: 118/78 HR 69             01/22/21 10:15 am: 113/70 HR 66             01/23/21   9:48 am: 117/75 HR 73              01/25/21 10:30 pm: 114/73 HR 74             01/26/21   3:05 am: 112/67 HR 63   He stated that he is having the chest pain at night around 1-2 am. This occurs only at night and normally lasts 1-2 hours and then resolves itself. He said the pain is a 2/10 and does not radiate. He denies any other symptoms.   He stated that he does not have the chest pain during the day and is still able to run several miles a day with no symptoms.   The patient has a follow up appointment on 6/6. He wanted to call and let Dr. Bjorn Pippin know an update in case he had any recommendations.

## 2021-01-28 NOTE — Progress Notes (Deleted)
Cardiology Office Note:    Date:  01/28/2021   ID:  Steven Mann, DOB 1971/05/28, MRN [id number]  PCP:  Elisabeth Cara, PA-C  Cardiologist:  None  Electrophysiologist:  None   Referring MD: Elisabeth Cara, *   No chief complaint on file. ***  History of Present Illness:    Steven Mann is a 50 y.o. male with a hx of hypertension hyperlipidemia who presents for hospital follow-up.  He was admitted to [facility] from 5/26 through 01/19/2021 with chest pain.  CTA chest on 5/26 showed no aortic pathology but significant coronary calcifications.  Lexiscan Myoview on 01/19/2021 showed normal perfusion, EF 55%.  Echocardiogram on 5/27 showed normal biventricular function, no significant valvular disease.  No past medical history on file.  Past Surgical History:  Procedure Laterality Date  . VASECTOMY      Current Medications: No outpatient medications have been marked as taking for the 01/29/21 encounter (Appointment) with Donato Heinz, MD.     Allergies:   Patient has no known allergies.   Social History   Socioeconomic History  . Marital status: Married    Spouse name: Not on file  . Number of children: Not on file  . Years of education: Not on file  . Highest education level: Not on file  Occupational History  . Occupation: Scientist, clinical (histocompatibility and immunogenetics)  Tobacco Use  . Smoking status: Former Smoker    Years: 15.00    Quit date: 07/25/1999    Years since quitting: 21.5  . Smokeless tobacco: Never Used  Vaping Use  . Vaping Use: Never used  Substance and Sexual Activity  . Alcohol use: Yes    Comment: occasionally   . Drug use: No  . Sexual activity: Not on file  Other Topics Concern  . Not on file  Social History Narrative  . Not on file   Social Determinants of Health   Financial Resource Strain: Not on file  Food Insecurity: Not on file  Transportation Needs: Not on file  Physical Activity: Not on file  Stress: Not on file  Social Connections: Not  on file     Family History: The patient's ***family history includes CAD in his brother and maternal grandfather; CVA in his paternal grandfather; Heart disease in his father.  ROS:   Please see the history of present illness.    *** All other systems reviewed and are negative.  EKGs/Labs/Other Studies Reviewed:    The following studies were reviewed today: ***  EKG:  EKG is *** ordered today.  The ekg ordered today demonstrates ***  Recent Labs: 01/18/2021: BUN 14; Creatinine, Ser 0.87; Hemoglobin 13.5; Platelets 250; Potassium 3.8; Sodium 140  Recent Lipid Panel    Component Value Date/Time   CHOL 145 01/19/2021 0503   TRIG 74 01/19/2021 0503   HDL 49 01/19/2021 0503   CHOLHDL 3.0 01/19/2021 0503   VLDL 15 01/19/2021 0503   LDLCALC 81 01/19/2021 0503    Physical Exam:    VS:  There were no vitals taken for this visit.    Wt Readings from Last 3 Encounters:  01/18/21 168 lb 12.8 oz (76.6 kg)  07/24/17 175 lb (79.4 kg)     GEN: *** Well nourished, well developed in no acute distress HEENT: Normal NECK: No JVD; No carotid bruits LYMPHATICS: No lymphadenopathy CARDIAC: ***RRR, no murmurs, rubs, gallops RESPIRATORY:  Clear to auscultation without rales, wheezing or rhonchi  ABDOMEN: Soft, non-tender, non-distended MUSCULOSKELETAL:  No edema; No deformity  SKIN: Warm and dry NEUROLOGIC:  Alert and oriented x 3 PSYCHIATRIC:  Normal affect   ASSESSMENT:    No diagnosis found. PLAN:    Chest pain: Atypical in description.  CTA chest 01/18/2021 showed significant coronary calcifications.  Lexiscan Myoview on 01/19/2021 showed normal perfusion, EF 55%.  Echocardiogram on 5/27 showed normal biventricular function, no significant valvular disease.  Normal ESR/CRP. -No further cardiac work-up recommended  Hypertension: On amlodipine 5 mg  Hyperlipidemia:  LDL 81 on 01/19/2021, started atorvastatin 20 mg daily for goal LDL less than 70  RTC in***  Medication  Adjustments/Labs and Tests Ordered: Current medicines are reviewed at length with the patient today.  Concerns regarding medicines are outlined above.  No orders of the defined types were placed in this encounter.  No orders of the defined types were placed in this encounter.   There are no Patient Instructions on file for this visit.   Signed, Donato Heinz, MD  01/28/2021 10:12 PM    [place]

## 2021-01-29 ENCOUNTER — Ambulatory Visit: Payer: BC Managed Care – PPO | Admitting: Cardiology

## 2021-01-29 ENCOUNTER — Other Ambulatory Visit: Payer: Self-pay

## 2021-01-29 ENCOUNTER — Encounter: Payer: Self-pay | Admitting: Cardiology

## 2021-01-29 VITALS — BP 140/73 | HR 82 | Ht 70.0 in | Wt 167.4 lb

## 2021-01-29 DIAGNOSIS — R079 Chest pain, unspecified: Secondary | ICD-10-CM | POA: Diagnosis not present

## 2021-01-29 DIAGNOSIS — E785 Hyperlipidemia, unspecified: Secondary | ICD-10-CM

## 2021-01-29 DIAGNOSIS — I10 Essential (primary) hypertension: Secondary | ICD-10-CM

## 2021-01-29 NOTE — Progress Notes (Signed)
Cardiology Office Note:    Date:  01/29/2021   ID:  Steven Mann, DOB August 03, 1971, MRN [id number]  PCP:  Elisabeth Cara, PA-C  Cardiologist:  None  Electrophysiologist:  None   Referring MD: Elisabeth Cara, *   Chief Complaint  Patient presents with  . Chest Pain    History of Present Illness:    Steven Mann is a 50 y.o. male with a hx of hypertension hyperlipidemia who presents for hospital follow-up.  He was admitted to [facility] from 5/26 through 01/19/2021 with chest pain.  CTA chest on 5/26 showed no aortic pathology but significant coronary calcifications.  Lexiscan Myoview on 01/19/2021 showed normal perfusion, EF 55%.  Echocardiogram on 5/27 showed normal biventricular function, no significant valvular disease.  Since last clinic visit, he continues to wake up in the middle of the night with left chest pain. This occurs for 50% of his nights, and he usually sleeps on his left side. His pain "ebbs and flows," and he describes it as "two fingers pushing into his chest." The chest pain is tolerable for him.  He has tried to lie still but feels no relief, and after an hour or two of constant pain he will fall asleep again. Throughout the day he may still feel the chest pain. He knows it does not correlate with deep breathing, and it seems to be unrelated to his blood pressure. At home, his blood pressure is typically averaging in the 110s-130s/70s-80s, and does not drop below 597 systolic. Of note, he does not feel any chest pain when he exercises. Also, he is feeling more lightheaded lately, especially when his blood pressure is low or after heavy exercising. Recently he started a low sodium diet and has lost about 6 pounds. He denies any shortness of breath, palpitations, or exertional symptoms. No headaches, lightheadedness, or syncope to report. Also has no lower extremity edema, orthopnea or PND. He does see a gastroenterologist.  He has plans to travel in [place] soon.  No  past medical history on file.  Past Surgical History:  Procedure Laterality Date  . VASECTOMY      Current Medications: Current Meds  Medication Sig  . amLODipine (NORVASC) 5 MG tablet Take 1 tablet (5 mg total) by mouth daily.  Marland Kitchen aspirin EC 81 MG EC tablet Take 1 tablet (81 mg total) by mouth daily. Swallow whole.  Marland Kitchen atorvastatin (LIPITOR) 20 MG tablet Take 1 tablet (20 mg total) by mouth daily.  Marland Kitchen ibuprofen (ADVIL) 200 MG tablet Take 400 mg by mouth every 6 (six) hours as needed for mild pain.  Marland Kitchen tetrahydrozoline-zinc (VISINE-AC) 0.05-0.25 % ophthalmic solution Place 2 drops into both eyes daily as needed (dry eyes).     Allergies:   Patient has no known allergies.   Social History   Socioeconomic History  . Marital status: Married    Spouse name: Not on file  . Number of children: Not on file  . Years of education: Not on file  . Highest education level: Not on file  Occupational History  . Occupation: Scientist, clinical (histocompatibility and immunogenetics)  Tobacco Use  . Smoking status: Former Smoker    Years: 15.00    Quit date: 07/25/1999    Years since quitting: 21.5  . Smokeless tobacco: Never Used  Vaping Use  . Vaping Use: Never used  Substance and Sexual Activity  . Alcohol use: Yes    Comment: occasionally   . Drug use: No  . Sexual activity: Not on file  Other Topics Concern  . Not on file  Social History Narrative  . Not on file   Social Determinants of Health   Financial Resource Strain: Not on file  Food Insecurity: Not on file  Transportation Needs: Not on file  Physical Activity: Not on file  Stress: Not on file  Social Connections: Not on file     Family History: The patient's family history includes CAD in his brother and maternal grandfather; CVA in his paternal grandfather; Heart disease in his father.  ROS:   Please see the history of present illness.    (+) Left chest pain (+) Anxiety (+) Lightheadedness All other systems reviewed and are  negative.  EKGs/Labs/Other Studies Reviewed:    The following studies were reviewed today:  NM Myocar Multi 01/19/2021: Narrative & Impression   There was no ST segment deviation noted during stress.  Nuclear stress EF: 55%. No wall motion abnormalities  The study is normal. No significant perfusion defects detected at rest or stress.  This is a low risk study.    Echo 01/19/2021: 1. Left ventricular ejection fraction, by estimation, is 55 to 60%. The  left ventricle has normal function. The left ventricle has no regional  wall motion abnormalities. Left ventricular diastolic parameters were  normal.  2. Right ventricular systolic function is normal. The right ventricular  size is normal. There is normal pulmonary artery systolic pressure. The  estimated right ventricular systolic pressure is 35.4 mmHg.  3. The mitral valve is grossly normal. Trivial mitral valve  regurgitation. No evidence of mitral stenosis.  4. The aortic valve is tricuspid. Aortic valve regurgitation is not  visualized. No aortic stenosis is present.  5. The inferior vena cava is normal in size with greater than 50%  respiratory variability, suggesting right atrial pressure of 3 mmHg.  CT Angio Chest Aorta 01/18/2021: IMPRESSION: 1. Normal CTA of the aorta. 2. Age advanced atheromatous coronary calcification.  EKG:   01/29/2021: NSR, rate 82, LVH  Recent Labs: 01/18/2021: BUN 14; Creatinine, Ser 0.87; Hemoglobin 13.5; Platelets 250; Potassium 3.8; Sodium 140  Recent Lipid Panel    Component Value Date/Time   CHOL 145 01/19/2021 0503   TRIG 74 01/19/2021 0503   HDL 49 01/19/2021 0503   CHOLHDL 3.0 01/19/2021 0503   VLDL 15 01/19/2021 0503   LDLCALC 81 01/19/2021 0503    Physical Exam:    VS:  BP 140/73   Pulse 82   Ht _0  (1.778 m)   Wt 167 lb 6.4 oz (75.9 kg)   SpO2 97%   BMI 24.02 kg/m     Wt Readings from Last 3 Encounters:  01/29/21 167 lb 6.4 oz (75.9 kg)  01/18/21 168 lb  12.8 oz (76.6 kg)  07/24/17 175 lb (79.4 kg)     GEN: Well nourished, well developed in no acute distress HEENT: Normal NECK: No JVD; No carotid bruits LYMPHATICS: No lymphadenopathy CARDIAC: RRR, 2/6 systolic murmur, no rubs, no gallops RESPIRATORY:  Clear to auscultation without rales, wheezing or rhonchi  ABDOMEN: Soft, non-tender, non-distended MUSCULOSKELETAL:  No edema; No deformity  SKIN: Warm and dry NEUROLOGIC:  Alert and oriented x 3 PSYCHIATRIC:  Normal affect   ASSESSMENT:    1. Chest pain of uncertain etiology   2. Essential hypertension   3. Hyperlipidemia, unspecified hyperlipidemia type    PLAN:    Chest pain: Atypical in description.  Does not occur with exertion.  CTA chest 01/18/2021 showed significant coronary calcifications.  Lexiscan  Myoview on 01/19/2021 showed normal perfusion, EF 55%.  Echocardiogram on 5/27 showed normal biventricular function, no significant valvular disease.  Normal ESR/CRP.  Suspect noncardiac etiology, likely musculoskeletal. -No further cardiac work-up recommended  Hypertension: On amlodipine 5 mg daily.  Mildly elevated in clinic today, but appears controlled from his home log.  Hyperlipidemia:  LDL 81 on 01/19/2021, started atorvastatin 20 mg daily for goal LDL less than 70  RTC in 6 months   Medication Adjustments/Labs and Tests Ordered: Current medicines are reviewed at length with the patient today.  Concerns regarding medicines are outlined above.  Orders Placed This Encounter  Procedures  . EKG 12-Lead   No orders of the defined types were placed in this encounter.   Patient Instructions  Medication Instructions:  Your physician recommends that you continue on your current medications as directed. Please refer to the Current Medication list given to you today.  *If you need a refill on your cardiac medications before your next appointment, please call your pharmacy*  Follow-Up: At [facility], you and your health  needs are our priority.  As part of our continuing mission to provide you with exceptional heart care, we have created designated Provider Care Teams.  These Care Teams include your primary Cardiologist (physician) and Advanced Practice Providers (APPs -  Physician Assistants and Nurse Practitioners) who all work together to provide you with the care you need, when you need it.  We recommend signing up for the patient portal called "MyChart".  Sign up information is provided on this After Visit Summary.  MyChart is used to connect with patients for Virtual Visits (Telemedicine).  Patients are able to view lab/test results, encounter notes, upcoming appointments, etc.  Non-urgent messages can be sent to your provider as well.   To learn more about what you can do with MyChart, go to [email or web address].    Your next appointment:   6 month(s)  The format for your next appointment:   In Person  Provider:   Oswaldo Milian, MD         [facility],acting as a scribe for Donato Heinz, MD.,have documented all relevant documentation on the behalf of Donato Heinz, MD,as directed by  Donato Heinz, MD while in the presence of Donato Heinz, MD.  I, Donato Heinz, MD, have reviewed all documentation for this visit. The documentation on 01/29/21 for the exam, diagnosis, procedures, and orders are all accurate and complete.   Signed, Donato Heinz, MD  01/29/2021 1:53 PM    [facility]

## 2021-01-29 NOTE — Patient Instructions (Signed)

## 2021-01-31 DIAGNOSIS — R0789 Other chest pain: Secondary | ICD-10-CM | POA: Diagnosis not present

## 2021-01-31 DIAGNOSIS — R079 Chest pain, unspecified: Secondary | ICD-10-CM | POA: Diagnosis not present

## 2021-01-31 DIAGNOSIS — H9311 Tinnitus, right ear: Secondary | ICD-10-CM | POA: Diagnosis not present

## 2021-02-06 ENCOUNTER — Ambulatory Visit: Payer: BC Managed Care – PPO | Admitting: Cardiology

## 2021-03-06 DIAGNOSIS — H903 Sensorineural hearing loss, bilateral: Secondary | ICD-10-CM | POA: Diagnosis not present

## 2021-03-06 DIAGNOSIS — H9311 Tinnitus, right ear: Secondary | ICD-10-CM | POA: Diagnosis not present

## 2021-03-23 DIAGNOSIS — Z130 Encounter for screening for diseases of the blood and blood-forming organs and certain disorders involving the immune mechanism: Secondary | ICD-10-CM | POA: Diagnosis not present

## 2021-03-23 DIAGNOSIS — Z1211 Encounter for screening for malignant neoplasm of colon: Secondary | ICD-10-CM | POA: Diagnosis not present

## 2021-03-23 DIAGNOSIS — Z1329 Encounter for screening for other suspected endocrine disorder: Secondary | ICD-10-CM | POA: Diagnosis not present

## 2021-03-23 DIAGNOSIS — Z0000 Encounter for general adult medical examination without abnormal findings: Secondary | ICD-10-CM | POA: Diagnosis not present

## 2021-03-27 ENCOUNTER — Ambulatory Visit: Payer: BC Managed Care – PPO | Admitting: Cardiology

## 2021-03-28 ENCOUNTER — Other Ambulatory Visit: Payer: Self-pay

## 2021-03-28 MED ORDER — ATORVASTATIN CALCIUM 20 MG PO TABS: 20.0000 mg | ORAL_TABLET | Freq: Every day | ORAL | 3 refills | Status: DC

## 2021-03-28 MED ORDER — ASPIRIN 81 MG PO TBEC: 81.0000 mg | DELAYED_RELEASE_TABLET | Freq: Every day | ORAL | 3 refills | Status: AC

## 2021-03-28 MED ORDER — AMLODIPINE BESYLATE 5 MG PO TABS: 5.0000 mg | ORAL_TABLET | Freq: Every day | ORAL | 3 refills | Status: DC

## 2021-07-24 ENCOUNTER — Other Ambulatory Visit: Payer: Self-pay

## 2021-07-24 ENCOUNTER — Emergency Department (HOSPITAL_BASED_OUTPATIENT_CLINIC_OR_DEPARTMENT_OTHER): Payer: BC Managed Care – PPO

## 2021-07-24 ENCOUNTER — Encounter (HOSPITAL_BASED_OUTPATIENT_CLINIC_OR_DEPARTMENT_OTHER): Payer: Self-pay | Admitting: *Deleted

## 2021-07-24 ENCOUNTER — Emergency Department (HOSPITAL_BASED_OUTPATIENT_CLINIC_OR_DEPARTMENT_OTHER)
Admission: EM | Admit: 2021-07-24 | Discharge: 2021-07-24 | Disposition: A | Discharge status: Home / Self Care | Payer: BC Managed Care – PPO | Attending: Emergency Medicine | Admitting: Emergency Medicine

## 2021-07-24 DIAGNOSIS — R4020 Unspecified coma: Secondary | ICD-10-CM

## 2021-07-24 DIAGNOSIS — W01198A Fall on same level from slipping, tripping and stumbling with subsequent striking against other object, initial encounter: Secondary | ICD-10-CM | POA: Insufficient documentation

## 2021-07-24 DIAGNOSIS — I10 Essential (primary) hypertension: Secondary | ICD-10-CM | POA: Diagnosis not present

## 2021-07-24 DIAGNOSIS — Z87891 Personal history of nicotine dependence: Secondary | ICD-10-CM | POA: Insufficient documentation

## 2021-07-24 DIAGNOSIS — R55 Syncope and collapse: Secondary | ICD-10-CM | POA: Insufficient documentation

## 2021-07-24 DIAGNOSIS — S0990XA Unspecified injury of head, initial encounter: Secondary | ICD-10-CM | POA: Insufficient documentation

## 2021-07-24 DIAGNOSIS — Z79899 Other long term (current) drug therapy: Secondary | ICD-10-CM | POA: Diagnosis not present

## 2021-07-24 DIAGNOSIS — S0993XA Unspecified injury of face, initial encounter: Secondary | ICD-10-CM | POA: Insufficient documentation

## 2021-07-24 DIAGNOSIS — S199XXA Unspecified injury of neck, initial encounter: Secondary | ICD-10-CM | POA: Diagnosis not present

## 2021-07-24 DIAGNOSIS — S069X9A Unspecified intracranial injury with loss of consciousness of unspecified duration, initial encounter: Secondary | ICD-10-CM | POA: Diagnosis not present

## 2021-07-24 HISTORY — DX: Essential (primary) hypertension: I10

## 2021-07-24 HISTORY — DX: Pure hypercholesterolemia, unspecified: E78.00

## 2021-07-24 NOTE — Discharge Instructions (Addendum)
Please take it easy as needed if you have any lightheadedness, blurry vision, nausea, vomiting. Please return for further evaluation if you have persistent symptoms. I would refrain from heading the soccer ball for the next few days. It was a pleasure taking care of you today.

## 2021-07-24 NOTE — ED Provider Notes (Addendum)
[facility] EMERGENCY DEPARTMENT Provider Note   CSN: [id number] Arrival date & time: 07/24/21  1258     History Chief Complaint  Patient presents with   Facial Injury    Steven Mann is a 50 y.o. male with no significant past medical history who presents with concern for facial injury this morning with loss of consciousness.  Patient reports he had a mechanical, nonsyncopal fall, in which he tripped over his wife's laundry, face planted into drywall.  Patient reports that he lost consciousness for approximately 30 seconds.  Patient reports brief feeling of lightheadedness, nausea immediately after awaking and standing up quickly, however no persistent confusion, nausea, weakness, vision changes, photophobia.  He reports some right-sided neck pain, concerned that his neck snapped back.  Patient reports he has had similar injuries playing soccer before.  Patient reports that pain is manageable at this time, he is not taking anything for pain at this time.   Facial Injury Associated symptoms: neck pain       Past Medical History:  Diagnosis Date   High cholesterol    Hypertension     Patient Active Problem List   Diagnosis Date Noted   Chest pain of uncertain etiology 01/18/2021   Family history of early CAD 01/18/2021   Dyslipidemia 01/18/2021    Past Surgical History:  Procedure Laterality Date   VASECTOMY         Family History  Problem Relation Age of Onset   Heart disease Father        pacemaker, carotid stenosis   CAD Brother        sudden death MI at age 70y   CAD Maternal Grandfather        died of MI in early 77s   CVA Paternal Grandfather        39-70    Social History   Tobacco Use   Smoking status: Former    Years: 15.00    Types: Cigarettes    Quit date: 07/25/1999    Years since quitting: 22.0   Smokeless tobacco: Never  Vaping Use   Vaping Use: Never used  Substance Use Topics   Alcohol use: Not Currently    Comment:  occasionally    Drug use: No    Home Medications Prior to Admission medications   Medication Sig Start Date End Date Taking? Authorizing Provider  amLODipine (NORVASC) 5 MG tablet Take 1 tablet (5 mg total) by mouth daily. 03/28/21 07/24/21 Yes Little Ishikawa, MD  atorvastatin (LIPITOR) 20 MG tablet Take 1 tablet (20 mg total) by mouth daily. 03/28/21 07/24/21 Yes Little Ishikawa, MD  ibuprofen (ADVIL) 200 MG tablet Take 400 mg by mouth every 6 (six) hours as needed for mild pain.    [provider]  tetrahydrozoline-zinc (VISINE-AC) 0.05-0.25 % ophthalmic solution Place 2 drops into both eyes daily as needed (dry eyes).    [provider]    Allergies    Patient has no known allergies.  Review of Systems   Review of Systems  Musculoskeletal:  Positive for neck pain.  All other systems reviewed and are negative.  Physical Exam Updated Vital Signs BP 139/86 (BP Location: Right Arm)   Pulse 63   Temp 98.3 F (36.8 C) (Oral)   Resp 17   Ht 5\' 10"  (1.778 m)   Wt 73.9 kg   SpO2 100%   BMI 23.39 kg/m   Physical Exam Vitals and nursing note reviewed.  Constitutional:  General: He is not in acute distress.    Appearance: Normal appearance.  HENT:     Head: Normocephalic and atraumatic.  Eyes:     General:        Right eye: No discharge.        Left eye: No discharge.  Neck:     Comments: Tenderness to palpation without separate or deformity of the midline cervical spine.  Tenderness to palpation of the right cervical paraspinous muscles.  Intact range of motion of the neck. Cardiovascular:     Rate and Rhythm: Normal rate and regular rhythm.     Heart sounds: No murmur heard.   No friction rub. No gallop.  Pulmonary:     Effort: Pulmonary effort is normal.     Breath sounds: Normal breath sounds.  Abdominal:     General: Bowel sounds are normal.     Palpations: Abdomen is soft.  Skin:    General: Skin is warm and dry.     Capillary  Refill: Capillary refill takes less than 2 seconds.     Comments: Minimal redness, tenderness overlying the right maxillary bone.  No excoriation or laceration noted.  Neurological:     Mental Status: He is alert and oriented to person, place, and time.     Comments: CN III through XII grossly intact.  Intact strength 5 out of 5 bilateral upper and lower extremities.  Romberg negative, gait normal.  Intact finger-nose.  Alert and oriented x3.  Psychiatric:        Mood and Affect: Mood normal.        Behavior: Behavior normal.    ED Results / Procedures / Treatments   Labs (all labs ordered are listed, but only abnormal results are displayed) Labs Reviewed - No data to display  EKG None  Radiology CT Head Wo Contrast  Result Date: 07/24/2021 CLINICAL DATA:  Trauma. EXAM: CT HEAD WITHOUT CONTRAST CT CERVICAL SPINE WITHOUT CONTRAST TECHNIQUE: Multidetector CT imaging of the head and cervical spine was performed following the standard protocol without intravenous contrast. Multiplanar CT image reconstructions of the cervical spine were also generated. COMPARISON:  CT head 01/18/2021. FINDINGS: CT HEAD FINDINGS Brain: No evidence of acute infarction, hemorrhage, hydrocephalus, extra-axial collection or mass lesion/mass effect. Vascular: No hyperdense vessel or unexpected calcification. Skull: Normal. Negative for fracture or focal lesion. Sinuses/Orbits: No acute finding. Other: None. CT CERVICAL SPINE FINDINGS Alignment: Normal. Skull base and vertebrae: No acute fracture. No primary bone lesion or focal pathologic process. Soft tissues and spinal canal: No prevertebral fluid or swelling. No visible canal hematoma. Disc levels: No significant central canal or neural foraminal stenosis at any level. Upper chest: Negative. Other: None. IMPRESSION: No acute intracranial process. No acute fracture or traumatic subluxation of the cervical spine. Electronically Signed   By: Darliss Cheney M.D.   On:  07/24/2021 17:05   CT Cervical Spine Wo Contrast  Result Date: 07/24/2021 CLINICAL DATA:  Trauma. EXAM: CT HEAD WITHOUT CONTRAST CT CERVICAL SPINE WITHOUT CONTRAST TECHNIQUE: Multidetector CT imaging of the head and cervical spine was performed following the standard protocol without intravenous contrast. Multiplanar CT image reconstructions of the cervical spine were also generated. COMPARISON:  CT head 01/18/2021. FINDINGS: CT HEAD FINDINGS Brain: No evidence of acute infarction, hemorrhage, hydrocephalus, extra-axial collection or mass lesion/mass effect. Vascular: No hyperdense vessel or unexpected calcification. Skull: Normal. Negative for fracture or focal lesion. Sinuses/Orbits: No acute finding. Other: None. CT CERVICAL SPINE FINDINGS Alignment: Normal. Skull base  and vertebrae: No acute fracture. No primary bone lesion or focal pathologic process. Soft tissues and spinal canal: No prevertebral fluid or swelling. No visible canal hematoma. Disc levels: No significant central canal or neural foraminal stenosis at any level. Upper chest: Negative. Other: None. IMPRESSION: No acute intracranial process. No acute fracture or traumatic subluxation of the cervical spine. Electronically Signed   By: Darliss Cheney M.D.   On: 07/24/2021 17:05    Procedures Procedures   Medications Ordered in ED Medications - No data to display  ED Course  I have reviewed the triage vital signs and the nursing notes.  Pertinent labs & imaging results that were available during my care of the patient were reviewed by me and considered in my medical decision making (see chart for details).    MDM Rules/Calculators/A&P                         Overall well-appearing male with a brief episode of loss of consciousness following mechanical, nonsyncopal collision with drywall.  Patient with no history of syncope.  No feeling of heart racing, chest pain, shortness of breath.  Patient with very transient feeling of  lightheadedness, nausea after the event with no persistence of the symptoms.  Given transient loss of consciousness will obtain CT head, CT C-spine, and EKG to ensure no occult cardiogenic syncope.    Patient without any focal neurologic deficit.  Patient denies request for pain medication at this time.  Patient with negative CT head, C-spine, unremarkable EKG. EKG without any signs of ischemia, arrhythmia.  No signs of Brugada, Wolff-Parkinson-White, or hypertrophic cardiomyopathy.  Patient without any history of early cardiac death in family.  Some left ventricular hypertrophy, however patient is very healthy, active, believe that this is physiologically normal finding in this patient.  Discharged in stable condition at this time, return precautions given.  After being monitored for several hours of the course of his visit patient still without any postconcussive symptoms.  Intact neurologic exam throughout.  Discussed head injury precautions, and discussed strict return precautions.  Patient understands and agrees to plan.  Encouraged Tylenol, ibuprofen as needed for neck pain.  Discussed follow-up with orthopedics if pain worsens or fails to improve despite treatment. Final Clinical Impression(s) / ED Diagnoses Final diagnoses:  Loss of consciousness (HCC)  Injury of head, initial encounter    Rx / DC Orders ED Discharge Orders     None           [place] 07/24/21 1734    Alvira Monday, MD 07/25/21 1226

## 2021-07-24 NOTE — ED Triage Notes (Signed)
He ran into a door frame this am. Passed out briefly afterward. Alert oriented, ambulatory. States he feels fine.

## 2021-07-29 NOTE — Progress Notes (Signed)
Cardiology Office Note:    Date:  08/01/2021   ID:  Steven Mann, DOB 24-Feb-1971, MRN [id number]  PCP:  Elisabeth Cara, PA-C  Cardiologist:  None  Electrophysiologist:  None   Referring MD: Elisabeth Cara, *   Chief Complaint  Patient presents with   Chest Pain     History of Present Illness:    Steven Mann is a 50 y.o. male with a hx of hypertension hyperlipidemia who presents for follow-up.  He was admitted to [facility] from 5/26 through 01/19/2021 with chest pain.  CTA chest on 5/26 showed no aortic pathology but significant coronary calcifications.  Lexiscan Myoview on 01/19/2021 showed normal perfusion, EF 55%.  Echocardiogram on 5/27 showed normal biventricular function, no significant valvular disease.  Since last clinic visit, he reports that he continues to have chest pain.  He runs, lifts weights, and play soccer, reports chest pain does not occur with exertion.  He denies any palpitations, lower extremity edema, lightheadedness, or syncope.  He brought his home BP log with him, BP has been averaging 119/70 at home.    Past Medical History:  Diagnosis Date   High cholesterol    Hypertension     Past Surgical History:  Procedure Laterality Date   VASECTOMY      Current Medications: Current Meds  Medication Sig   aspirin EC 81 MG tablet Take 81 mg by mouth daily. Swallow whole.   ibuprofen (ADVIL) 200 MG tablet Take 400 mg by mouth every 6 (six) hours as needed for mild pain.   tetrahydrozoline-zinc (VISINE-AC) 0.05-0.25 % ophthalmic solution Place 2 drops into both eyes daily as needed (dry eyes).     Allergies:   Patient has no known allergies.   Social History   Socioeconomic History   Marital status: Married    Spouse name: Not on file   Number of children: Not on file   Years of education: Not on file   Highest education level: Not on file  Occupational History   Occupation: Scientist, clinical (histocompatibility and immunogenetics)  Tobacco Use   Smoking status: Former     Years: 15.00    Types: Cigarettes    Quit date: 07/25/1999    Years since quitting: 22.0   Smokeless tobacco: Never  Vaping Use   Vaping Use: Never used  Substance and Sexual Activity   Alcohol use: Not Currently    Comment: occasionally    Drug use: No   Sexual activity: Not on file  Other Topics Concern   Not on file  Social History Narrative   Not on file   Social Determinants of Health   Financial Resource Strain: Not on file  Food Insecurity: Not on file  Transportation Needs: Not on file  Physical Activity: Not on file  Stress: Not on file  Social Connections: Not on file     Family History: The patient's family history includes CAD in his brother and maternal grandfather; CVA in his paternal grandfather; Heart disease in his father.  ROS:   Please see the history of present illness.    (+) Left chest pain (+) Anxiety (+) Lightheadedness All other systems reviewed and are negative.  EKGs/Labs/Other Studies Reviewed:    The following studies were reviewed today:  NM Myocar Multi 01/19/2021: Narrative & Impression  There was no ST segment deviation noted during stress. Nuclear stress EF: 55%. No wall motion abnormalities The study is normal. No significant perfusion defects detected at rest or stress. This is a low risk  study.    Echo 01/19/2021: 1. Left ventricular ejection fraction, by estimation, is 55 to 60%. The  left ventricle has normal function. The left ventricle has no regional  wall motion abnormalities. Left ventricular diastolic parameters were  normal.   2. Right ventricular systolic function is normal. The right ventricular  size is normal. There is normal pulmonary artery systolic pressure. The  estimated right ventricular systolic pressure is 04.5 mmHg.   3. The mitral valve is grossly normal. Trivial mitral valve  regurgitation. No evidence of mitral stenosis.   4. The aortic valve is tricuspid. Aortic valve regurgitation is not   visualized. No aortic stenosis is present.   5. The inferior vena cava is normal in size with greater than 50%  respiratory variability, suggesting right atrial pressure of 3 mmHg.  CT Angio Chest Aorta 01/18/2021: IMPRESSION: 1. Normal CTA of the aorta. 2. Age advanced atheromatous coronary calcification.  EKG:   01/29/2021: NSR, rate 82, LVH  Recent Labs: 01/18/2021: BUN 14; Creatinine, Ser 0.87; Hemoglobin 13.5; Platelets 250; Potassium 3.8; Sodium 140  Recent Lipid Panel    Component Value Date/Time   CHOL 145 01/19/2021 0503   TRIG 74 01/19/2021 0503   HDL 49 01/19/2021 0503   CHOLHDL 3.0 01/19/2021 0503   VLDL 15 01/19/2021 0503   LDLCALC 81 01/19/2021 0503    Physical Exam:    VS:  BP 140/77   Pulse 66   Ht 5' 10"  (1.778 m)   Wt 167 lb (75.8 kg)   SpO2 99%   BMI 23.96 kg/m     Wt Readings from Last 3 Encounters:  08/01/21 167 lb (75.8 kg)  07/24/21 163 lb (73.9 kg)  01/29/21 167 lb 6.4 oz (75.9 kg)     GEN: Well nourished, well developed in no acute distress HEENT: Normal NECK: No JVD; No carotid bruits LYMPHATICS: No lymphadenopathy CARDIAC: RRR, 2/6 systolic murmur, no rubs, no gallops RESPIRATORY:  Clear to auscultation without rales, wheezing or rhonchi  ABDOMEN: Soft, non-tender, non-distended MUSCULOSKELETAL:  No edema; No deformity  SKIN: Warm and dry NEUROLOGIC:  Alert and oriented x 3 PSYCHIATRIC:  Normal affect   ASSESSMENT:    1. Chest pain of uncertain etiology   2. Hyperlipidemia, unspecified hyperlipidemia type   3. Essential hypertension     PLAN:    CAD: reported atypical chest pain, does not occur with exertion.  CTA chest 01/18/2021 showed significant coronary calcifications.  Lexiscan Myoview on 01/19/2021 showed normal perfusion, EF 55%.  Echocardiogram on 5/27 showed normal biventricular function, no significant valvular disease.  Normal ESR/CRP.  Suspect noncardiac etiology, likely musculoskeletal. -No further cardiac work-up  recommended -Continue aspirin 81 mg daily -Continue atorvastatin 20 mg daily  Hypertension: On amlodipine 5 mg daily.  Mildly elevated in clinic today, but home BP log shows good control  Hyperlipidemia:  LDL 81 on 01/19/2021, started atorvastatin 20 mg daily for goal LDL less than 70.  Will check lipid panel  RTC in 1 year   Medication Adjustments/Labs and Tests Ordered: Current medicines are reviewed at length with the patient today.  Concerns regarding medicines are outlined above.  Orders Placed This Encounter  Procedures   Lipid panel    No orders of the defined types were placed in this encounter.   Patient Instructions  Medication Instructions:  The current medical regimen is effective;  continue present plan and medications.  *If you need a refill on your cardiac medications before your next appointment, please call your  pharmacy*   Lab Work: LIPID (come back, fasting- nothing to eat or drink, no lab appointment needed)   If you have labs (blood work) drawn today and your tests are completely normal, you will receive your results only by: [place] (if you have MyChart) OR A paper copy in the mail If you have any lab test that is abnormal or we need to change your treatment, we will call you to review the results.  Follow-Up: At [facility], you and your health needs are our priority.  As part of our continuing mission to provide you with exceptional heart care, we have created designated Provider Care Teams.  These Care Teams include your primary Cardiologist (physician) and Advanced Practice Providers (APPs -  Physician Assistants and Nurse Practitioners) who all work together to provide you with the care you need, when you need it.  We recommend signing up for the patient portal called "MyChart".  Sign up information is provided on this After Visit Summary.  MyChart is used to connect with patients for Virtual Visits (Telemedicine).  Patients are able to view  lab/test results, encounter notes, upcoming appointments, etc.  Non-urgent messages can be sent to your provider as well.   To learn more about what you can do with MyChart, go to [email or web address].    Your next appointment:   12 month(s)  The format for your next appointment:   In Person  Provider:   Dr.Ezrie Bunyan        Signed, Donato Heinz, MD  08/01/2021 4:41 PM    [place]

## 2021-08-01 ENCOUNTER — Other Ambulatory Visit: Payer: Self-pay

## 2021-08-01 ENCOUNTER — Ambulatory Visit (INDEPENDENT_AMBULATORY_CARE_PROVIDER_SITE_OTHER): Payer: BC Managed Care – PPO | Admitting: Cardiology

## 2021-08-01 ENCOUNTER — Encounter: Payer: Self-pay | Admitting: Cardiology

## 2021-08-01 VITALS — BP 140/77 | HR 66 | Ht 70.0 in | Wt 167.0 lb

## 2021-08-01 DIAGNOSIS — E785 Hyperlipidemia, unspecified: Secondary | ICD-10-CM

## 2021-08-01 DIAGNOSIS — I10 Essential (primary) hypertension: Secondary | ICD-10-CM

## 2021-08-01 DIAGNOSIS — R079 Chest pain, unspecified: Secondary | ICD-10-CM

## 2021-08-01 NOTE — Patient Instructions (Addendum)
Medication Instructions:  The current medical regimen is effective;  continue present plan and medications.  *If you need a refill on your cardiac medications before your next appointment, please call your pharmacy*   Lab Work: LIPID (come back, fasting- nothing to eat or drink, no lab appointment needed)   If you have labs (blood work) drawn today and your tests are completely normal, you will receive your results only by: MyChart Message (if you have MyChart) OR A paper copy in the mail If you have any lab test that is abnormal or we need to change your treatment, we will call you to review the results.  Follow-Up: At [facility], you and your health needs are our priority.  As part of our continuing mission to provide you with exceptional heart care, we have created designated Provider Care Teams.  These Care Teams include your primary Cardiologist (physician) and Advanced Practice Providers (APPs -  Physician Assistants and Nurse Practitioners) who all work together to provide you with the care you need, when you need it.  We recommend signing up for the patient portal called "MyChart".  Sign up information is provided on this After Visit Summary.  MyChart is used to connect with patients for Virtual Visits (Telemedicine).  Patients are able to view lab/test results, encounter notes, upcoming appointments, etc.  Non-urgent messages can be sent to your provider as well.   To learn more about what you can do with MyChart, go to [email or web address].    Your next appointment:   12 month(s)  The format for your next appointment:   In Person  Provider:   Dr.Schumann

## 2021-08-13 DIAGNOSIS — E785 Hyperlipidemia, unspecified: Secondary | ICD-10-CM | POA: Diagnosis not present

## 2021-08-13 LAB — LIPID PANEL
Chol/HDL Ratio: 1.9 ratio (ref 0.0–5.0)
Cholesterol, Total: 108 mg/dL (ref 100–199)
HDL: 56 mg/dL (ref >39)
LDL Chol Calc (NIH): 42 mg/dL (ref 0–99)
Triglycerides: 37 mg/dL (ref 0–149)
VLDL Cholesterol Cal: 10 mg/dL (ref 5–40)

## 2022-02-14 DIAGNOSIS — L03113 Cellulitis of right upper limb: Secondary | ICD-10-CM | POA: Diagnosis not present

## 2022-04-04 ENCOUNTER — Other Ambulatory Visit: Payer: Self-pay | Admitting: Cardiology

## 2022-04-05 ENCOUNTER — Other Ambulatory Visit: Payer: Self-pay | Admitting: Cardiology

## 2022-04-25 DIAGNOSIS — Z13228 Encounter for screening for other metabolic disorders: Secondary | ICD-10-CM | POA: Diagnosis not present

## 2022-04-25 DIAGNOSIS — Z13220 Encounter for screening for lipoid disorders: Secondary | ICD-10-CM | POA: Diagnosis not present

## 2022-04-25 DIAGNOSIS — Z0000 Encounter for general adult medical examination without abnormal findings: Secondary | ICD-10-CM | POA: Diagnosis not present

## 2022-04-25 DIAGNOSIS — Z130 Encounter for screening for diseases of the blood and blood-forming organs and certain disorders involving the immune mechanism: Secondary | ICD-10-CM | POA: Diagnosis not present

## 2022-04-27 ENCOUNTER — Other Ambulatory Visit: Payer: Self-pay | Admitting: Cardiology

## 2022-06-20 ENCOUNTER — Encounter: Payer: Self-pay | Admitting: Cardiology

## 2022-09-12 DIAGNOSIS — D649 Anemia, unspecified: Secondary | ICD-10-CM | POA: Diagnosis not present

## 2022-09-16 ENCOUNTER — Encounter: Payer: Self-pay | Admitting: Cardiology

## 2022-09-16 ENCOUNTER — Ambulatory Visit: Payer: BC Managed Care – PPO | Attending: Cardiology | Admitting: Cardiology

## 2022-09-16 VITALS — BP 138/64 | HR 61 | Ht 70.0 in | Wt 168.2 lb

## 2022-09-16 DIAGNOSIS — I10 Essential (primary) hypertension: Secondary | ICD-10-CM

## 2022-09-16 DIAGNOSIS — I2510 Atherosclerotic heart disease of native coronary artery without angina pectoris: Secondary | ICD-10-CM | POA: Diagnosis not present

## 2022-09-16 DIAGNOSIS — E785 Hyperlipidemia, unspecified: Secondary | ICD-10-CM

## 2022-09-16 DIAGNOSIS — R079 Chest pain, unspecified: Secondary | ICD-10-CM

## 2022-09-16 NOTE — Progress Notes (Signed)
Cardiology Office Note:    Date:  09/16/2022   ID:  Steven Mann, DOB 1971/06/15, MRN [id number]  PCP:  Steven Cara, PA-C  Cardiologist:  None  Electrophysiologist:  None   Referring MD: Steven Mann, *   No chief complaint on file.    History of Present Illness:    Steven Mann is a 52 y.o. male with a hx of hypertension hyperlipidemia who presents for follow-up.  He was admitted to [facility] from 5/26 through 01/19/2021 with chest pain.  CTA chest on 5/26 showed no aortic pathology but significant coronary calcifications.  Lexiscan Myoview on 01/19/2021 showed normal perfusion, EF 55%.  Echocardiogram on 5/27 showed normal biventricular function, no significant valvular disease.  Since last clinic visit, he reports that he is doing okay.  Continues to run, lift weights, and play soccer.  However reports a recent change in his symptoms.  States when he does high intensity interval training, has been limited by shortness of breath and has chest pain afterwards.  Describes chest pain as pressure in his chest, lasts up to 10 minutes.  He also reports having lightheadedness but denies any syncope.  He denies any lower extremity edema or palpitations.  He reports BP is in 120s when checks at home.   Past Medical History:  Diagnosis Date   High cholesterol    Hypertension     Past Surgical History:  Procedure Laterality Date   VASECTOMY      Current Medications: Current Meds  Medication Sig   ASPIRIN LOW DOSE 81 MG tablet TAKE 1 TABLET (81 MG TOTAL) BY MOUTH DAILY. SWALLOW WHOLE.   atorvastatin (LIPITOR) 20 MG tablet TAKE 1 TABLET BY MOUTH EVERY DAY     Allergies:   Patient has no known allergies.   Social History   Socioeconomic History   Marital status: Married    Spouse name: Not on file   Number of children: Not on file   Years of education: Not on file   Highest education level: Not on file  Occupational History   Occupation: Scientist, clinical (histocompatibility and immunogenetics)   Tobacco Use   Smoking status: Former    Years: 15.00    Types: Cigarettes    Quit date: 07/25/1999    Years since quitting: 23.1   Smokeless tobacco: Never  Vaping Use   Vaping Use: Never used  Substance and Sexual Activity   Alcohol use: Not Currently    Comment: occasionally    Drug use: No   Sexual activity: Not on file  Other Topics Concern   Not on file  Social History Narrative   Not on file   Social Determinants of Health   Financial Resource Strain: Not on file  Food Insecurity: Not on file  Transportation Needs: Not on file  Physical Activity: Not on file  Stress: Not on file  Social Connections: Not on file     Family History: The patient's family history includes CAD in his brother and maternal grandfather; CVA in his paternal grandfather; Heart disease in his father.  ROS:   Please see the history of present illness.     All other systems reviewed and are negative.  EKGs/Labs/Other Studies Reviewed:    The following studies were reviewed today:  NM Myocar Multi 01/19/2021: Narrative & Impression  There was no ST segment deviation noted during stress. Nuclear stress EF: 55%. No wall motion abnormalities The study is normal. No significant perfusion defects detected at rest or stress. This is a  low risk study.    Echo 01/19/2021: 1. Left ventricular ejection fraction, by estimation, is 55 to 60%. The  left ventricle has normal function. The left ventricle has no regional  wall motion abnormalities. Left ventricular diastolic parameters were  normal.   2. Right ventricular systolic function is normal. The right ventricular  size is normal. There is normal pulmonary artery systolic pressure. The  estimated right ventricular systolic pressure is 49.4 mmHg.   3. The mitral valve is grossly normal. Trivial mitral valve  regurgitation. No evidence of mitral stenosis.   4. The aortic valve is tricuspid. Aortic valve regurgitation is not  visualized. No  aortic stenosis is present.   5. The inferior vena cava is normal in size with greater than 50%  respiratory variability, suggesting right atrial pressure of 3 mmHg.  CT Angio Chest Aorta 01/18/2021: IMPRESSION: 1. Normal CTA of the aorta. 2. Age advanced atheromatous coronary calcification.  EKG:   01/29/2021: NSR, rate 82, LVH 09/16/2022: Normal sinus rhythm, LVH, concave ST elevation consistent with early repolarization, rate 60  Recent Labs: No results found for requested labs within last 365 days.  Recent Lipid Panel    Component Value Date/Time   CHOL 108 08/13/2021 0809   TRIG 37 08/13/2021 0809   HDL 56 08/13/2021 0809   CHOLHDL 1.9 08/13/2021 0809   CHOLHDL 3.0 01/19/2021 0503   VLDL 15 01/19/2021 0503   LDLCALC 42 08/13/2021 0809    Physical Exam:    VS:  BP 138/64   Pulse 61   Ht 5\' 10"  (1.778 m)   Wt 168 lb 3.2 oz (76.3 kg)   SpO2 100%   BMI 24.13 kg/m     Wt Readings from Last 3 Encounters:  09/16/22 168 lb 3.2 oz (76.3 kg)  08/01/21 167 lb (75.8 kg)  07/24/21 163 lb (73.9 kg)     GEN: Well nourished, well developed in no acute distress HEENT: Normal NECK: No JVD; No carotid bruits LYMPHATICS: No lymphadenopathy CARDIAC: RRR, 2/6 systolic murmur, no rubs, no gallops RESPIRATORY:  Clear to auscultation without rales, wheezing or rhonchi  ABDOMEN: Soft, non-tender, non-distended MUSCULOSKELETAL:  No edema; No deformity  SKIN: Warm and dry NEUROLOGIC:  Alert and oriented x 3 PSYCHIATRIC:  Normal affect   ASSESSMENT:    1. CAD in native artery   2. Chest pain of uncertain etiology   3. Essential hypertension   4. Hyperlipidemia, unspecified hyperlipidemia type      PLAN:    CAD: reported atypical chest pain.  CTA chest 01/18/2021 showed coronary calcifications.  Lexiscan Myoview on 01/19/2021 showed normal perfusion, EF 55%.  Echocardiogram on 5/27 showed normal biventricular function, no significant valvular disease.  Normal ESR/CRP.   -Continue  aspirin 81 mg daily -Continue atorvastatin 20 mg daily -He is now reporting exertional dyspnea, along with chest pain that occurs after exercising.  Recommend coronary CTA for further evaluation.  Low resting heart rate, will not give beta-blocker prior to study.  Hypertension: On amlodipine 5 mg daily.  Appears controlled  Hyperlipidemia:  LDL 81 on 01/19/2021, started atorvastatin 20 mg daily for goal LDL less than 70.  LDL 43 on 03/2022  RTC in 1 year   Medication Adjustments/Labs and Tests Ordered: Current medicines are reviewed at length with the patient today.  Concerns regarding medicines are outlined above.  Orders Placed This Encounter  Procedures   CT CORONARY MORPH W/CTA COR W/SCORE W/CA W/CM &/OR WO/CM   Basic metabolic panel  EKG 12-Lead    No orders of the defined types were placed in this encounter.    Patient Instructions  Medication Instructions:  Your physician recommends that you continue on your current medications as directed. Please refer to the Current Medication list given to you today.  *If you need a refill on your cardiac medications before your next appointment, please call your pharmacy*   Lab Work: BMET prior to CT scan  Our in office lab hours are Monday-Friday 8:00-4:00, closed for lunch 12:45-1:45 pm.  No appointment needed.  [facility] locations:   [place] - [address] - [address] ([facility]) - [address] [phone] [address]   [place] - [address]    [place]  - [address]    [place] - [address] - [address] [employer]  - [address] [place] - [address] Chief Technology Officer)  Testing/Procedures: Coronary CTA-see instructions below  Follow-Up: At [facility], you and your health needs are our priority.  As part of our continuing mission to provide you with  exceptional heart care, we have created designated Provider Care Teams.  These Care Teams include your primary Cardiologist (physician) and Advanced Practice Providers (APPs -  Physician Assistants and Nurse Practitioners) who all work together to provide you with the care you need, when you need it.  We recommend signing up for the patient portal called "MyChart".  Sign up information is provided on this After Visit Summary.  MyChart is used to connect with patients for Virtual Visits (Telemedicine).  Patients are able to view lab/test results, encounter notes, upcoming appointments, etc.  Non-urgent messages can be sent to your provider as well.   To learn more about what you can do with MyChart, go to [email or web address].    Your next appointment:   12 month(s)  Provider:   Dr. Bjorn Pippin Other Instructions   Your cardiac CT will be scheduled at one of the below locations:   [facility] [address] [place], [place] [address] [phone]  If scheduled at [facility], please arrive at the [facility] and Children's Entrance (Entrance C2) of [facility] 30 minutes prior to test start time. You can use the FREE valet parking offered at entrance C (encouraged to control the heart rate for the test)  Proceed to the [facility] Radiology Department (first floor) to check-in and test prep.  All radiology patients and guests should use entrance C2 at [facility], accessed from [facility], even though the hospital's physical address listed is [address].    Please follow these instructions carefully (unless otherwise directed):  Hold all erectile dysfunction medications at least 3 days (72 hrs) prior to test. (Ie viagra, cialis, sildenafil, tadalafil, etc) We will administer nitroglycerin during this exam.   On the Night Before the Test: Be sure to Drink plenty of water. Do not consume any caffeinated/decaffeinated beverages  or chocolate 12 hours prior to your test. Do not take any antihistamines 12 hours prior to your test.  On the Day of the Test: Drink plenty of water until 1 hour prior to the test. Do not eat any food 1 hour prior to test. You may take your regular medications prior to the test.   After the Test: Drink plenty of water. After receiving  IV contrast, you may experience a mild flushed feeling. This is normal. On occasion, you may experience a mild rash up to 24 hours after the test. This is not dangerous. If this occurs, you can take Benadryl 25 mg and increase your fluid intake. If you experience trouble breathing, this can be serious. If it is severe call 911 IMMEDIATELY. If it is mild, please call our office. If you take any of these medications: Glipizide/Metformin, Avandament, Glucavance, please do not take 48 hours after completing test unless otherwise instructed.  We will call to schedule your test 2-4 weeks out understanding that some insurance companies will need an authorization prior to the service being performed.   For non-scheduling related questions, please contact the cardiac imaging nurse navigator should you have any questions/concerns: Rockwell [place], Cardiac Imaging Nurse Navigator Larey Brick, Cardiac Imaging Nurse Navigator [facility] Direct Office Dial: [phone]   For scheduling needs, including cancellations and rescheduling, please call [place], [phone].       Signed, Little Ishikawa, MD  09/16/2022 5:35 PM    [facility]

## 2022-09-16 NOTE — Patient Instructions (Addendum)
Medication Instructions:  Your physician recommends that you continue on your current medications as directed. Please refer to the Current Medication list given to you today.  *If you need a refill on your cardiac medications before your next appointment, please call your pharmacy*   Lab Work: BMET prior to CT scan  Our in office lab hours are Monday-Friday 8:00-4:00, closed for lunch 12:45-1:45 pm.  No appointment needed.  [facility] locations:   [place] ([place]) - [address] [employer] [place] [address] - [address] [place] - [address] Oncologist)  Testing/Procedures: Coronary CTA-see instructions below  Follow-Up: At [facility], you and your health needs are our priority.  As part of our continuing mission to provide you with exceptional heart care, we have created designated Provider Care Teams.  These Care Teams include your primary Cardiologist (physician) and Advanced Practice Providers (APPs -  Physician Assistants and Nurse Practitioners) who all work together to provide you with the care you need, when you need it.  We recommend signing up for the patient portal called "MyChart".  Sign up information is provided on this After Visit Summary.  MyChart is used to connect with patients for Virtual Visits (Telemedicine).  Patients are able to view lab/test results, encounter notes, upcoming appointments, etc.  Non-urgent messages can be sent to your provider as well.   To learn more about what you can do with MyChart, go to [email or web address].    Your next appointment:   12 month(s)  Provider:   Dr. Gardiner Rhyme Other Instructions   Your cardiac CT will be scheduled at one of  the below locations:   [facility] [address] [place], [place] [address] [phone]  If scheduled at [facility], please arrive at the [facility] En[facility] (Entrance C2) of [facility] 30 minutes prior to test start time. You can use the FREE valet parking offered at entrance C (encouraged to control the heart rate for the test)  Proceed to the [facility] Radiology Department (first floor) to check-in and test prep.  All radiology patients and guests should use entrance C2 at [facility], accessed from [facility], even though the hospital's physical address listed is [address].    Please follow these instructions carefully (unless otherwise directed):  Hold all erectile dysfunction medications at least 3 days (72 hrs) prior to test. (Ie viagra, cialis, sildenafil, tadalafil, etc) We will administer nitroglycerin during this exam.   On the Night Before the Test: Be sure to Drink plenty of water. Do not consume any caffeinated/decaffeinated beverages or chocolate 12 hours prior to your test. Do not take any antihistamines 12 hours prior to your test.  On the Day of the Test: Drink plenty of water until 1 hour prior to the test. Do not eat any food 1 hour prior to test. You may take your regular medications prior to the test.   After the Test: Drink plenty of water. After receiving IV contrast, you may experience a mild flushed feeling. This is normal. On occasion, you may experience a mild rash up to 24  hours after the test. This is not dangerous. If this occurs, you can take Benadryl 25 mg and increase your fluid intake. If you experience trouble breathing, this can be serious. If it is severe call 911 IMMEDIATELY. If it is mild, please call our office. If you take any of these medications: Glipizide/Metformin, Avandament, Glucavance, please do not take 48 hours after completing test unless otherwise  instructed.  We will call to schedule your test 2-4 weeks out understanding that some insurance companies will need an authorization prior to the service being performed.   For non-scheduling related questions, please contact the cardiac imaging nurse navigator should you have any questions/concerns: Marchia Bond, Cardiac Imaging Nurse Navigator Gordy Clement, Cardiac Imaging Nurse Navigator [facility] Direct Office Dial: [phone]   For scheduling needs, including cancellations and rescheduling, please call [place], [phone].

## 2022-09-18 DIAGNOSIS — I2510 Atherosclerotic heart disease of native coronary artery without angina pectoris: Secondary | ICD-10-CM | POA: Diagnosis not present

## 2022-09-18 DIAGNOSIS — R079 Chest pain, unspecified: Secondary | ICD-10-CM | POA: Diagnosis not present

## 2022-09-18 DIAGNOSIS — I10 Essential (primary) hypertension: Secondary | ICD-10-CM | POA: Diagnosis not present

## 2022-09-19 LAB — BASIC METABOLIC PANEL
BUN/Creatinine Ratio: 16 (ref 9–20)
BUN: 14 mg/dL (ref 6–24)
CO2: 27 mmol/L (ref 20–29)
Calcium: 9.4 mg/dL (ref 8.7–10.2)
Chloride: 101 mmol/L (ref 96–106)
Creatinine, Ser: 0.86 mg/dL (ref 0.76–1.27)
Glucose: 89 mg/dL (ref 70–99)
Potassium: 4.9 mmol/L (ref 3.5–5.2)
Sodium: 138 mmol/L (ref 134–144)
eGFR: 104 mL/min/{1.73_m2} (ref >59)

## 2022-09-20 ENCOUNTER — Telehealth (HOSPITAL_COMMUNITY): Payer: Self-pay | Admitting: Emergency Medicine

## 2022-09-20 NOTE — Telephone Encounter (Signed)
Attempted to call patient regarding upcoming cardiac CT appointment. Left message on voicemail with name and callback number Marchia Bond RN Navigator [facility] [phone] Office [phone] Cell

## 2022-09-23 ENCOUNTER — Ambulatory Visit (HOSPITAL_COMMUNITY)
Admission: RE | Admit: 2022-09-23 | Discharge: 2022-09-23 | Disposition: A | Discharge status: Home / Self Care | Payer: BC Managed Care – PPO | Source: Ambulatory Visit | Attending: Cardiology | Admitting: Cardiology

## 2022-09-23 DIAGNOSIS — I2510 Atherosclerotic heart disease of native coronary artery without angina pectoris: Secondary | ICD-10-CM | POA: Diagnosis not present

## 2022-09-23 DIAGNOSIS — R931 Abnormal findings on diagnostic imaging of heart and coronary circulation: Secondary | ICD-10-CM | POA: Diagnosis not present

## 2022-09-23 DIAGNOSIS — R079 Chest pain, unspecified: Secondary | ICD-10-CM | POA: Insufficient documentation

## 2022-09-23 MED ORDER — IOHEXOL 350 MG/ML SOLN
100.0000 mL | Freq: Once | INTRAVENOUS | Status: AC | PRN
  Administered 2022-09-23: 100 mL via INTRAVENOUS

## 2022-09-23 MED ORDER — NITROGLYCERIN 0.4 MG SL SUBL
0.8000 mg | SUBLINGUAL_TABLET | Freq: Once | SUBLINGUAL | Status: AC
  Administered 2022-09-23: 0.8 mg via SUBLINGUAL

## 2022-09-23 MED ORDER — NITROGLYCERIN 0.4 MG SL SUBL
SUBLINGUAL_TABLET | SUBLINGUAL | Status: AC
  Filled 2022-09-23: qty 2

## 2022-09-24 ENCOUNTER — Encounter (HOSPITAL_BASED_OUTPATIENT_CLINIC_OR_DEPARTMENT_OTHER): Payer: Self-pay | Admitting: Cardiology

## 2022-09-24 ENCOUNTER — Other Ambulatory Visit: Payer: Self-pay | Admitting: Cardiology

## 2022-09-24 DIAGNOSIS — R931 Abnormal findings on diagnostic imaging of heart and coronary circulation: Secondary | ICD-10-CM

## 2022-09-25 ENCOUNTER — Other Ambulatory Visit: Payer: Self-pay | Admitting: Cardiology

## 2022-09-25 DIAGNOSIS — R931 Abnormal findings on diagnostic imaging of heart and coronary circulation: Secondary | ICD-10-CM

## 2022-09-26 ENCOUNTER — Ambulatory Visit (HOSPITAL_BASED_OUTPATIENT_CLINIC_OR_DEPARTMENT_OTHER)
Admission: RE | Admit: 2022-09-26 | Discharge: 2022-09-26 | Disposition: A | Discharge status: Home / Self Care | Payer: BC Managed Care – PPO | Source: Ambulatory Visit | Attending: Cardiology | Admitting: Cardiology

## 2022-09-26 DIAGNOSIS — R931 Abnormal findings on diagnostic imaging of heart and coronary circulation: Secondary | ICD-10-CM

## 2022-09-26 DIAGNOSIS — I2510 Atherosclerotic heart disease of native coronary artery without angina pectoris: Secondary | ICD-10-CM | POA: Diagnosis not present

## 2022-09-26 DIAGNOSIS — R079 Chest pain, unspecified: Secondary | ICD-10-CM | POA: Diagnosis not present

## 2022-10-02 ENCOUNTER — Other Ambulatory Visit: Payer: Self-pay | Admitting: Cardiology

## 2022-10-07 NOTE — Progress Notes (Addendum)
Cardiology Office Note:    Date:  10/08/2022   ID:  Steven Mann, DOB [birth year], MRN [id number]  PCP:  Elisabeth Cara, PA-C  Cardiologist:  None  Electrophysiologist:  None   Referring MD: Elisabeth Cara, *   Chief Complaint  Patient presents with   Coronary Artery Disease    History of Present Illness:    Steven Mann is a 52 y.o. male with a hx of hypertension hyperlipidemia who presents for follow-up.  He was admitted to [facility] from 5/26 through 01/19/2021 with chest pain.  CTA chest on 5/26 showed no aortic pathology but significant coronary calcifications.  Lexiscan Myoview on 01/19/2021 showed normal perfusion, EF 55%.  Echocardiogram on 5/27 showed normal biventricular function, no significant valvular disease.  Coronary CTA on 09/23/2022 showed mixed plaque in mid LAD with moderate (50 to 69%) stenosis but not significant by CT FFR (0.84), calcium score 1043 (99th percentile).  Since last clinic visit, he reports he is doing okay.  Continues to have intermittent chest pain.  Does report notes some pressure in chest when exercising.  Also reports intermittent lightheadedness.   Past Medical History:  Diagnosis Date   High cholesterol    Hypertension     Past Surgical History:  Procedure Laterality Date   VASECTOMY      Current Medications: Current Meds  Medication Sig   amLODipine (NORVASC) 5 MG tablet TAKE 1 TABLET (5 MG TOTAL) BY MOUTH DAILY.   ASPIRIN LOW DOSE 81 MG tablet TAKE 1 TABLET (81 MG TOTAL) BY MOUTH DAILY. SWALLOW WHOLE.   atorvastatin (LIPITOR) 20 MG tablet TAKE 1 TABLET BY MOUTH EVERY DAY     Allergies:   Patient has no known allergies.   Social History   Socioeconomic History   Marital status: Married    Spouse name: Not on file   Number of children: Not on file   Years of education: Not on file   Highest education level: Not on file  Occupational History   Occupation: Scientist, clinical (histocompatibility and immunogenetics)  Tobacco Use   Smoking status: Former     Years: 15.00    Types: Cigarettes    Quit date: 07/25/1999    Years since quitting: 23.2   Smokeless tobacco: Never  Vaping Use   Vaping Use: Never used  Substance and Sexual Activity   Alcohol use: Not Currently    Comment: occasionally    Drug use: No   Sexual activity: Not on file  Other Topics Concern   Not on file  Social History Narrative   Not on file   Social Determinants of Health   Financial Resource Strain: Not on file  Food Insecurity: Not on file  Transportation Needs: Not on file  Physical Activity: Not on file  Stress: Not on file  Social Connections: Not on file     Family History: The patient's family history includes CAD in his brother and maternal grandfather; CVA in his paternal grandfather; Heart disease in his father.  ROS:   Please see the history of present illness.     All other systems reviewed and are negative.  EKGs/Labs/Other Studies Reviewed:    The following studies were reviewed today:  NM Myocar Multi 01/19/2021: Narrative & Impression  There was no ST segment deviation noted during stress. Nuclear stress EF: 55%. No wall motion abnormalities The study is normal. No significant perfusion defects detected at rest or stress. This is a low risk study.    Echo 01/19/2021: 1. Left ventricular  ejection fraction, by estimation, is 55 to 60%. The  left ventricle has normal function. The left ventricle has no regional  wall motion abnormalities. Left ventricular diastolic parameters were  normal.   2. Right ventricular systolic function is normal. The right ventricular  size is normal. There is normal pulmonary artery systolic pressure. The  estimated right ventricular systolic pressure is 27.6 mmHg.   3. The mitral valve is grossly normal. Trivial mitral valve  regurgitation. No evidence of mitral stenosis.   4. The aortic valve is tricuspid. Aortic valve regurgitation is not  visualized. No aortic stenosis is present.   5. The  inferior vena cava is normal in size with greater than 50%  respiratory variability, suggesting right atrial pressure of 3 mmHg.  CT Angio Chest Aorta 01/18/2021: IMPRESSION: 1. Normal CTA of the aorta. 2. Age advanced atheromatous coronary calcification.  EKG:   01/29/2021: NSR, rate 82, LVH 09/16/2022: Normal sinus rhythm, LVH, concave ST elevation consistent with early repolarization, rate 60  Recent Labs: 09/18/2022: BUN 14; Creatinine, Ser 0.86; Potassium 4.9; Sodium 138  Recent Lipid Panel    Component Value Date/Time   CHOL 108 08/13/2021 0809   TRIG 37 08/13/2021 0809   HDL 56 08/13/2021 0809   CHOLHDL 1.9 08/13/2021 0809   CHOLHDL 3.0 01/19/2021 0503   VLDL 15 01/19/2021 0503   LDLCALC 42 08/13/2021 0809    Physical Exam:    VS:  BP 114/68 (BP Location: Left Arm, Patient Position: Sitting, Cuff Size: Normal)   Pulse 75   Ht 5\' 10"  (1.778 m)   Wt 165 lb 6.4 oz (75 kg)   SpO2 95%   BMI 23.73 kg/m     Wt Readings from Last 3 Encounters:  10/08/22 165 lb 6.4 oz (75 kg)  09/16/22 168 lb 3.2 oz (76.3 kg)  08/01/21 167 lb (75.8 kg)     GEN: Well nourished, well developed in no acute distress HEENT: Normal NECK: No JVD; No carotid bruits LYMPHATICS: No lymphadenopathy CARDIAC: RRR, 2/6 systolic murmur, no rubs, no gallops RESPIRATORY:  Clear to auscultation without rales, wheezing or rhonchi  ABDOMEN: Soft, non-tender, non-distended MUSCULOSKELETAL:  No edema; No deformity  SKIN: Warm and dry NEUROLOGIC:  Alert and oriented x 3 PSYCHIATRIC:  Normal affect   ASSESSMENT:    1. CAD in native artery   2. Lightheadedness   3. Essential hypertension   4. Hyperlipidemia, unspecified hyperlipidemia type      PLAN:    CAD: reported atypical chest pain.  CTA chest 01/18/2021 showed coronary calcifications.  Lexiscan Myoview on 01/19/2021 showed normal perfusion, EF 55%.  Echocardiogram on 5/27 showed normal biventricular function, no significant valvular disease.   Normal ESR/CRP.  Coronary CTA on 09/23/2022 showed mixed plaque in mid LAD with moderate (50 to 69%) stenosis but not significant by CT FFR (0.84), calcium score 1043 (99th percentile). -Continue aspirin 81 mg daily -Continue atorvastatin 20 mg daily  Hypertension: On amlodipine 5 mg daily.  Appears controlled  Hyperlipidemia:  LDL 81 on 01/19/2021, started atorvastatin 20 mg daily for goal LDL less than 70.  LDL 43 on 03/2022  Lightheadedness: Check carotid duplex  RTC in 6 months  DECIDE: Did you add a medication? No  If yes, how many?   If no, reason? Reason for not adding med: Other Cholesterol at goal LDL <55 on atorvastatin  Did you remove a medication? No  Did you increase the dosage of any medication? No  Did you decrease the dosage  of any medication? No  Did you refer to a specialist (i.e. lipid clinic, preventive cardiology, endocrinology)? No  Has patient seen plaque report? No       Medication Adjustments/Labs and Tests Ordered: Current medicines are reviewed at length with the patient today.  Concerns regarding medicines are outlined above.  Orders Placed This Encounter  Procedures   VAS US CAROTID    No orders of the defined types were placed in this encounter.    Patient Instructions  Medication Instructions:  Your physician recommends that you continue on your current medications as directed. Please refer to the Current Medication list given to you today.  *If you need a refill on your cardiac medications before your next appointment, please call your pharmacy*  Testing/Procedures: Your physician has requested that you have a carotid duplex. This test is an ultrasound of the carotid arteries in your neck. It looks at blood flow through these arteries that supply the brain with blood. Allow one hour for this exam. There are no restrictions or special instructions.  Follow-Up: At [facility], you and your health needs are our priority.  As  part of our continuing mission to provide you with exceptional heart care, we have created designated Provider Care Teams.  These Care Teams include your primary Cardiologist (physician) and Advanced Practice Providers (APPs -  Physician Assistants and Nurse Practitioners) who all work together to provide you with the care you need, when you need it.  We recommend signing up for the patient portal called "MyChart".  Sign up information is provided on this After Visit Summary.  MyChart is used to connect with patients for Virtual Visits (Telemedicine).  Patients are able to view lab/test results, encounter notes, upcoming appointments, etc.  Non-urgent messages can be sent to your provider as well.   To learn more about what you can do with MyChart, go to [email or web address].    Your next appointment:   6 month(s)  Provider:   Dr. Bjorn Pippin     Signed, Little Ishikawa, MD  10/08/2022 9:26 AM    [facility]

## 2022-10-08 ENCOUNTER — Ambulatory Visit: Payer: BC Managed Care – PPO | Attending: Cardiology | Admitting: Cardiology

## 2022-10-08 ENCOUNTER — Encounter: Payer: Self-pay | Admitting: Cardiology

## 2022-10-08 VITALS — BP 114/68 | HR 75 | Ht 70.0 in | Wt 165.4 lb

## 2022-10-08 DIAGNOSIS — E785 Hyperlipidemia, unspecified: Secondary | ICD-10-CM | POA: Diagnosis not present

## 2022-10-08 DIAGNOSIS — R42 Dizziness and giddiness: Secondary | ICD-10-CM

## 2022-10-08 DIAGNOSIS — I10 Essential (primary) hypertension: Secondary | ICD-10-CM

## 2022-10-08 DIAGNOSIS — I2510 Atherosclerotic heart disease of native coronary artery without angina pectoris: Secondary | ICD-10-CM | POA: Diagnosis not present

## 2022-10-08 NOTE — Patient Instructions (Signed)
Medication Instructions:  Your physician recommends that you continue on your current medications as directed. Please refer to the Current Medication list given to you today.  *If you need a refill on your cardiac medications before your next appointment, please call your pharmacy*  Testing/Procedures: Your physician has requested that you have a carotid duplex. This test is an ultrasound of the carotid arteries in your neck. It looks at blood flow through these arteries that supply the brain with blood. Allow one hour for this exam. There are no restrictions or special instructions.  Follow-Up: At [facility], you and your health needs are our priority.  As part of our continuing mission to provide you with exceptional heart care, we have created designated Provider Care Teams.  These Care Teams include your primary Cardiologist (physician) and Advanced Practice Providers (APPs -  Physician Assistants and Nurse Practitioners) who all work together to provide you with the care you need, when you need it.  We recommend signing up for the patient portal called "MyChart".  Sign up information is provided on this After Visit Summary.  MyChart is used to connect with patients for Virtual Visits (Telemedicine).  Patients are able to view lab/test results, encounter notes, upcoming appointments, etc.  Non-urgent messages can be sent to your provider as well.   To learn more about what you can do with MyChart, go to [email or web address].    Your next appointment:   6 month(s)  Provider:   Dr. Gardiner Rhyme

## 2022-10-11 DIAGNOSIS — R140 Abdominal distension (gaseous): Secondary | ICD-10-CM | POA: Diagnosis not present

## 2022-10-11 DIAGNOSIS — R1013 Epigastric pain: Secondary | ICD-10-CM | POA: Diagnosis not present

## 2022-10-11 DIAGNOSIS — R0789 Other chest pain: Secondary | ICD-10-CM | POA: Diagnosis not present

## 2022-10-16 ENCOUNTER — Ambulatory Visit (HOSPITAL_COMMUNITY)
Admission: RE | Admit: 2022-10-16 | Discharge: 2022-10-16 | Disposition: A | Discharge status: Home / Self Care | Payer: BC Managed Care – PPO | Source: Ambulatory Visit | Attending: Cardiology | Admitting: Cardiology

## 2022-10-16 DIAGNOSIS — R42 Dizziness and giddiness: Secondary | ICD-10-CM | POA: Diagnosis not present

## 2022-10-22 ENCOUNTER — Telehealth: Payer: Self-pay | Admitting: Cardiology

## 2022-10-22 DIAGNOSIS — E041 Nontoxic single thyroid nodule: Secondary | ICD-10-CM

## 2022-10-22 DIAGNOSIS — R931 Abnormal findings on diagnostic imaging of heart and coronary circulation: Secondary | ICD-10-CM

## 2022-10-22 NOTE — Telephone Encounter (Signed)
  Donato Heinz, MD      No blockages in carotid arteries.  Incidentally noted to have cystic lesion in the thyroid.  Recommend checking TSH and thyroid ultrasound    pt aware of results  Lab orders mailed to the pt  Order placed for [place].

## 2022-10-22 NOTE — Telephone Encounter (Signed)
Pt returning call for carotid test results

## 2022-10-30 ENCOUNTER — Encounter: Payer: Self-pay | Admitting: Cardiology

## 2022-11-01 DIAGNOSIS — E041 Nontoxic single thyroid nodule: Secondary | ICD-10-CM | POA: Diagnosis not present

## 2022-11-02 LAB — TSH: TSH: 1.08 u[IU]/mL (ref 0.450–4.500)

## 2022-11-15 ENCOUNTER — Ambulatory Visit
Admission: RE | Admit: 2022-11-15 | Discharge: 2022-11-15 | Disposition: A | Discharge status: Home / Self Care | Payer: BC Managed Care – PPO | Source: Ambulatory Visit | Attending: Cardiology | Admitting: Cardiology

## 2022-11-15 DIAGNOSIS — E041 Nontoxic single thyroid nodule: Secondary | ICD-10-CM | POA: Diagnosis not present

## 2022-12-19 DIAGNOSIS — Z86010 Personal history of colonic polyps: Secondary | ICD-10-CM | POA: Diagnosis not present

## 2022-12-19 DIAGNOSIS — R072 Precordial pain: Secondary | ICD-10-CM | POA: Diagnosis not present

## 2023-01-30 DIAGNOSIS — J019 Acute sinusitis, unspecified: Secondary | ICD-10-CM | POA: Diagnosis not present

## 2023-01-30 DIAGNOSIS — H9201 Otalgia, right ear: Secondary | ICD-10-CM | POA: Diagnosis not present

## 2023-01-30 DIAGNOSIS — R051 Acute cough: Secondary | ICD-10-CM | POA: Diagnosis not present

## 2023-03-31 ENCOUNTER — Other Ambulatory Visit: Payer: Self-pay | Admitting: Cardiology

## 2023-04-17 ENCOUNTER — Ambulatory Visit: Payer: BC Managed Care – PPO | Attending: Cardiology | Admitting: Cardiology

## 2023-04-17 VITALS — BP 118/70 | HR 63 | Ht 70.0 in | Wt 163.8 lb

## 2023-04-17 DIAGNOSIS — I2510 Atherosclerotic heart disease of native coronary artery without angina pectoris: Secondary | ICD-10-CM | POA: Diagnosis not present

## 2023-04-17 DIAGNOSIS — I10 Essential (primary) hypertension: Secondary | ICD-10-CM

## 2023-04-17 DIAGNOSIS — E785 Hyperlipidemia, unspecified: Secondary | ICD-10-CM | POA: Diagnosis not present

## 2023-04-17 NOTE — Patient Instructions (Signed)
 Medication Instructions:  Your physician recommends that you continue on your current medications as directed. Please refer to the Current Medication list given to you today.  *If you need a refill on your cardiac medications before your next appointment, please call your pharmacy*     Follow-Up: At [facility], you and your health needs are our priority.  As part of our continuing mission to provide you with exceptional heart care, we have created designated Provider Care Teams.  These Care Teams include your primary Cardiologist (physician) and Advanced Practice Providers (APPs -  Physician Assistants and Nurse Practitioners) who all work together to provide you with the care you need, when you need it.   Your next appointment:   12 month(s)  Provider:   Little Ishikawa, MD

## 2023-04-17 NOTE — Progress Notes (Signed)
Cardiology Office Note:    Date:  04/17/2023   ID:  Steven Mann, DOB 24-Sep-1970, MRN [id number]  PCP:  Burnis Medin, PA-C  Cardiologist:  Little Ishikawa, MD  Electrophysiologist:  None   Referring MD: Burnis Medin, *   Chief Complaint  Patient presents with   Coronary Artery Disease    History of Present Illness:    Steven Mann is a 52 y.o. male with a hx of hypertension hyperlipidemia who presents for follow-up.  He was admitted to [facility] from 5/26 through 01/19/2021 with chest pain.  CTA chest on 5/26 showed no aortic pathology but significant coronary calcifications.  Lexiscan Myoview on 01/19/2021 showed normal perfusion, EF 55%.  Echocardiogram on 5/27 showed normal biventricular function, no significant valvular disease.  Coronary CTA on 09/23/2022 showed mixed plaque in mid LAD with moderate (50 to 69%) stenosis but not significant by CT FFR (0.84), calcium score 1043 (99th percentile).  Since last clinic visit, he reports that he is doing well.  Denies any chest pain, dyspnea, lightheadedness, syncope, lower extremity edema, or palpitations.  He runs 5 miles 3-4 times per week and 10 miles on Sunday, also play soccer twice per week.  Denies any exertional symptoms.   Past Medical History:  Diagnosis Date   High cholesterol    Hypertension     Past Surgical History:  Procedure Laterality Date   VASECTOMY      Current Medications: Current Meds  Medication Sig   amLODipine (NORVASC) 5 MG tablet TAKE 1 TABLET (5 MG TOTAL) BY MOUTH DAILY.   ASPIRIN LOW DOSE 81 MG tablet TAKE 1 TABLET (81 MG TOTAL) BY MOUTH DAILY. SWALLOW WHOLE.   atorvastatin (LIPITOR) 20 MG tablet TAKE 1 TABLET BY MOUTH EVERY DAY   pantoprazole (PROTONIX) 40 MG tablet Take 40 mg by mouth daily.   [DISCONTINUED] predniSONE (STERAPRED UNI-PAK 21 TAB) 10 MG (21) TBPK tablet Take 10 mg by mouth daily.     Allergies:   Patient has no known allergies.   Social History    Socioeconomic History   Marital status: Married    Spouse name: Not on file   Number of children: Not on file   Years of education: Not on file   Highest education level: Not on file  Occupational History   Occupation: Surveyor, minerals  Tobacco Use   Smoking status: Former    Current packs/day: 0.00    Types: Cigarettes    Start date: 07/24/1984    Quit date: 07/25/1999    Years since quitting: 23.7   Smokeless tobacco: Never  Vaping Use   Vaping status: Never Used  Substance and Sexual Activity   Alcohol use: Not Currently    Comment: occasionally    Drug use: No   Sexual activity: Not on file  Other Topics Concern   Not on file  Social History Narrative   Not on file   Social Determinants of Health   Financial Resource Strain: Not on file  Food Insecurity: Low Risk  (01/06/2023)   [facility]   Food vital sign    Within the past 12 months, you worried that your food would run out before you got money to buy more: Never true    Within the past 12 months, the food you bought just didn't last and you didn't have money to get more. : Never true  Transportation Needs: Not on file (01/06/2023)  Physical Activity: Not on file  Stress:  Not on file  Social Connections: Not on file     Family History: The patient's family history includes CAD in his brother and maternal grandfather; CVA in his paternal grandfather; Heart disease in his father.  ROS:   Please see the history of present illness.     All other systems reviewed and are negative.  EKGs/Labs/Other Studies Reviewed:    The following studies were reviewed today:  NM Myocar Multi 01/19/2021: Narrative & Impression  There was no ST segment deviation noted during stress. Nuclear stress EF: 55%. No wall motion abnormalities The study is normal. No significant perfusion defects detected at rest or stress. This is a low risk study.    Echo 01/19/2021: 1. Left ventricular  ejection fraction, by estimation, is 55 to 60%. The  left ventricle has normal function. The left ventricle has no regional  wall motion abnormalities. Left ventricular diastolic parameters were  normal.   2. Right ventricular systolic function is normal. The right ventricular  size is normal. There is normal pulmonary artery systolic pressure. The  estimated right ventricular systolic pressure is 27.6 mmHg.   3. The mitral valve is grossly normal. Trivial mitral valve  regurgitation. No evidence of mitral stenosis.   4. The aortic valve is tricuspid. Aortic valve regurgitation is not  visualized. No aortic stenosis is present.   5. The inferior vena cava is normal in size with greater than 50%  respiratory variability, suggesting right atrial pressure of 3 mmHg.  CT Angio Chest Aorta 01/18/2021: IMPRESSION: 1. Normal CTA of the aorta. 2. Age advanced atheromatous coronary calcification.  EKG:   01/29/2021: NSR, rate 82, LVH 09/16/2022: Normal sinus rhythm, LVH, concave ST elevation consistent with early repolarization, rate 60 04/17/2023: Sinus bradycardia, rate 58, LVH, concave ST elevation consistent with early repolarization  Recent Labs: 09/18/2022: BUN 14; Creatinine, Ser 0.86; Potassium 4.9; Sodium 138 11/01/2022: TSH 1.080  Recent Lipid Panel    Component Value Date/Time   CHOL 108 08/13/2021 0809   TRIG 37 08/13/2021 0809   HDL 56 08/13/2021 0809   CHOLHDL 1.9 08/13/2021 0809   CHOLHDL 3.0 01/19/2021 0503   VLDL 15 01/19/2021 0503   LDLCALC 42 08/13/2021 0809    Physical Exam:    VS:  BP 118/70 (BP Location: Left Arm, Patient Position: Sitting, Cuff Size: Normal)   Pulse 63   Ht 5\' 10"  (1.778 m)   Wt 163 lb 12.8 oz (74.3 kg)   SpO2 99%   BMI 23.50 kg/m     Wt Readings from Last 3 Encounters:  04/17/23 163 lb 12.8 oz (74.3 kg)  10/08/22 165 lb 6.4 oz (75 kg)  09/16/22 168 lb 3.2 oz (76.3 kg)     GEN: Well nourished, well developed in no acute distress HEENT:  Normal NECK: No JVD; No carotid bruits LYMPHATICS: No lymphadenopathy CARDIAC: RRR, 2/6 systolic murmur, no rubs, no gallops RESPIRATORY:  Clear to auscultation without rales, wheezing or rhonchi  ABDOMEN: Soft, non-tender, non-distended MUSCULOSKELETAL:  No edema; No deformity  SKIN: Warm and dry NEUROLOGIC:  Alert and oriented x 3 PSYCHIATRIC:  Normal affect   ASSESSMENT:    1. CAD in native artery   2. Essential hypertension   3. Hyperlipidemia, unspecified hyperlipidemia type     PLAN:    CAD: reported atypical chest pain.  CTA chest 01/18/2021 showed coronary calcifications.  Lexiscan Myoview on 01/19/2021 showed normal perfusion, EF 55%.  Echocardiogram on 5/27 showed normal biventricular function, no significant valvular disease.  Normal ESR/CRP.  Coronary CTA on 09/23/2022 showed mixed plaque in mid LAD with moderate (50 to 69%) stenosis but not significant by CT FFR (0.84), calcium score 1043 (99th percentile). -Continue aspirin 81 mg daily -Continue atorvastatin 20 mg daily  Hypertension: On amlodipine 5 mg daily.  Appears controlled  Hyperlipidemia:  LDL 81 on 01/19/2021, started atorvastatin 20 mg daily for goal LDL less than 70.  LDL 43 on 03/2022.  He is having lipid panel drawn in next few weeks with PCP.  Will follow-up results.  Will also ask to check LP(a) as well  Lightheadedness: Carotid duplex unremarkable on 09/2022  Thyroid lesion: Cystic thyroid lesion noted on carotid duplex 09/2022.  TSH normal.  Thyroid ultrasound 11/15/2022 showed numerous tiny cysts and nodules, none of which met criteria to warrant biopsy or dedicated imaging surveillance  RTC in 1 year   Medication Adjustments/Labs and Tests Ordered: Current medicines are reviewed at length with the patient today.  Concerns regarding medicines are outlined above.  Orders Placed This Encounter  Procedures   EKG 12-Lead    No orders of the defined types were placed in this encounter.    Patient  Instructions  Medication Instructions:  Your physician recommends that you continue on your current medications as directed. Please refer to the Current Medication list given to you today.  *If you need a refill on your cardiac medications before your next appointment, please call your pharmacy*   Follow-Up: At [facility], you and your health needs are our priority.  As part of our continuing mission to provide you with exceptional heart care, we have created designated Provider Care Teams.  These Care Teams include your primary Cardiologist (physician) and Advanced Practice Providers (APPs -  Physician Assistants and Nurse Practitioners) who all work together to provide you with the care you need, when you need it.  Your next appointment:   12 month(s)  Provider:   Little Ishikawa, MD       Signed, Little Ishikawa, MD  04/17/2023 5:15 PM    [facility]

## 2023-04-26 ENCOUNTER — Other Ambulatory Visit: Payer: Self-pay | Admitting: Cardiology

## 2023-04-29 DIAGNOSIS — Z13220 Encounter for screening for lipoid disorders: Secondary | ICD-10-CM | POA: Diagnosis not present

## 2023-04-29 DIAGNOSIS — Z23 Encounter for immunization: Secondary | ICD-10-CM | POA: Diagnosis not present

## 2023-04-29 DIAGNOSIS — Z8249 Family history of ischemic heart disease and other diseases of the circulatory system: Secondary | ICD-10-CM | POA: Diagnosis not present

## 2023-04-29 DIAGNOSIS — Z0000 Encounter for general adult medical examination without abnormal findings: Secondary | ICD-10-CM | POA: Diagnosis not present

## 2023-05-01 ENCOUNTER — Encounter: Payer: Self-pay | Admitting: Cardiology

## 2023-05-02 DIAGNOSIS — Z0000 Encounter for general adult medical examination without abnormal findings: Secondary | ICD-10-CM | POA: Diagnosis not present

## 2023-05-02 DIAGNOSIS — D649 Anemia, unspecified: Secondary | ICD-10-CM | POA: Diagnosis not present

## 2023-08-07 ENCOUNTER — Other Ambulatory Visit: Payer: Self-pay | Admitting: Medical Genetics

## 2023-09-07 IMAGING — CT CT CERVICAL SPINE W/O CM
3 of 4 series · 12 of 33 positions shown, 14 images · non-contrast
Comparison: CT head 01/18/2021.

CLINICAL DATA: Trauma.

EXAM:
CT HEAD WITHOUT CONTRAST
CT CERVICAL SPINE WITHOUT CONTRAST
TECHNIQUE: Multidetector CT imaging of the head and cervical spine was
performed following the standard protocol without intravenous
contrast. Multiplanar CT image reconstructions of the cervical spine
were also generated.

[Series 5: coronals · coronal · 0.30mm/px · 3 of 61 slices shown]
[im 13/61  bone]
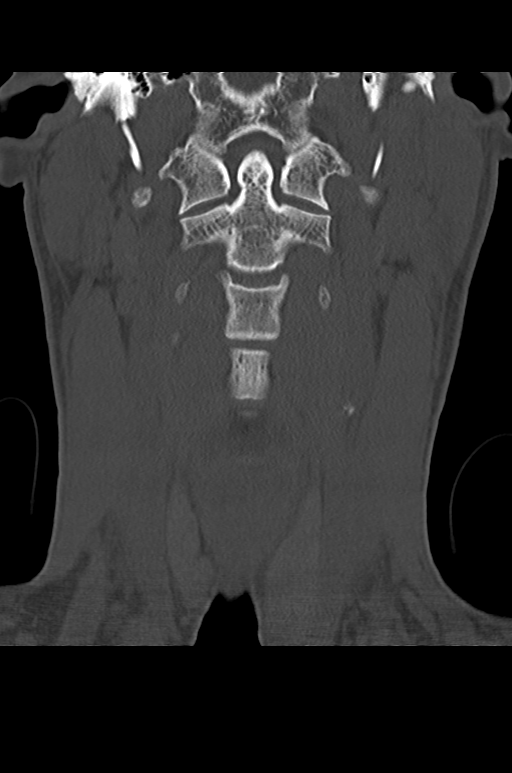
[im 25/61  bone]
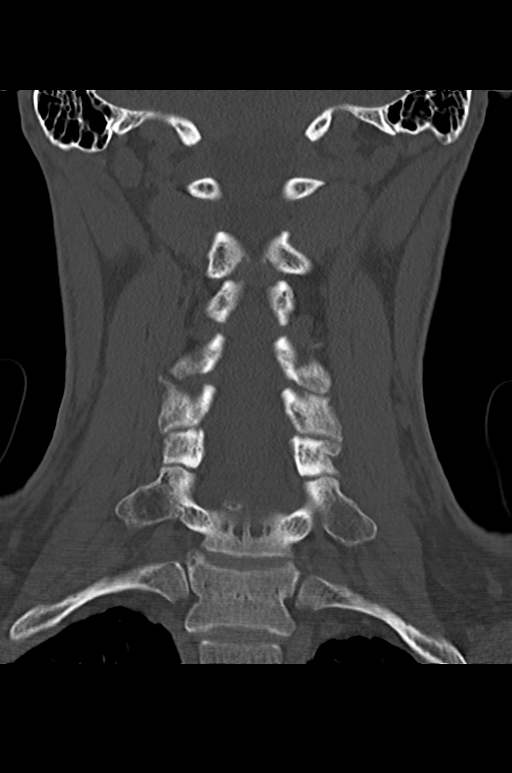
[im 37/61  bone]
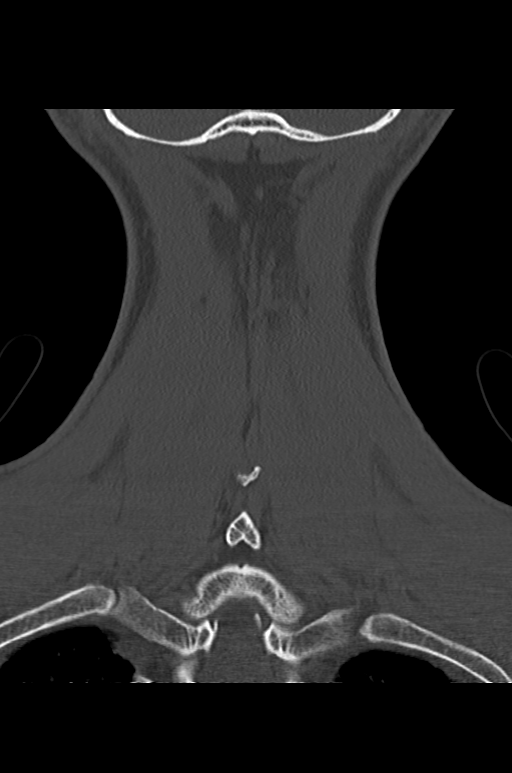

[Series 6: sagittals · sagittal · 0.31mm/px · 5 of 61 slices shown, 6 images]
[im 21/61  bone]
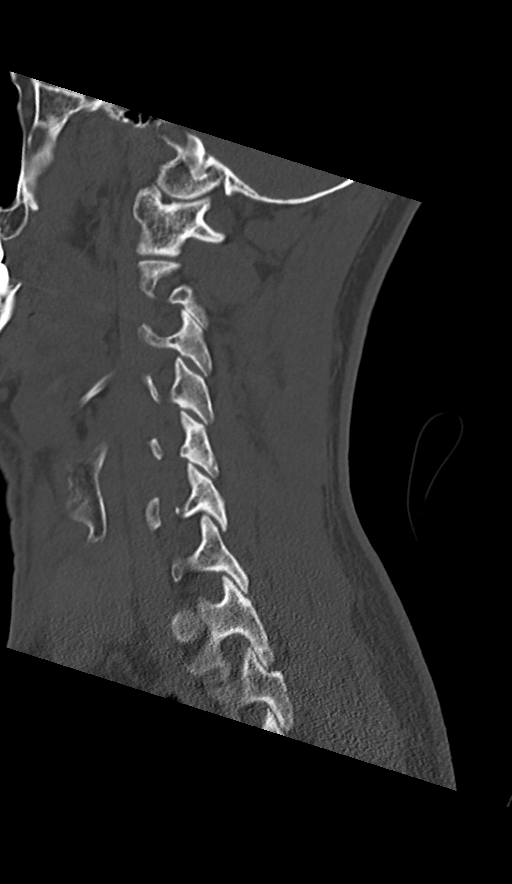
[im 26/61  bone]
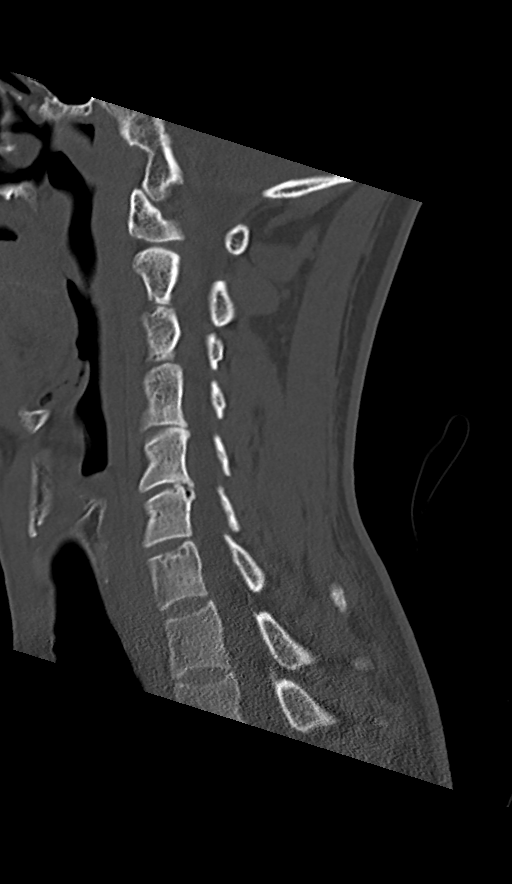
[im 31/61  soft-tissue]
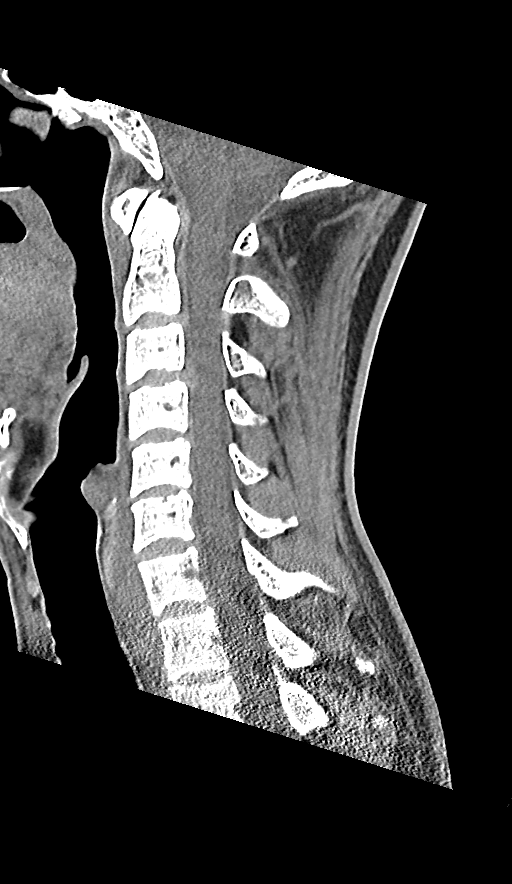
[im 31/61  bone]
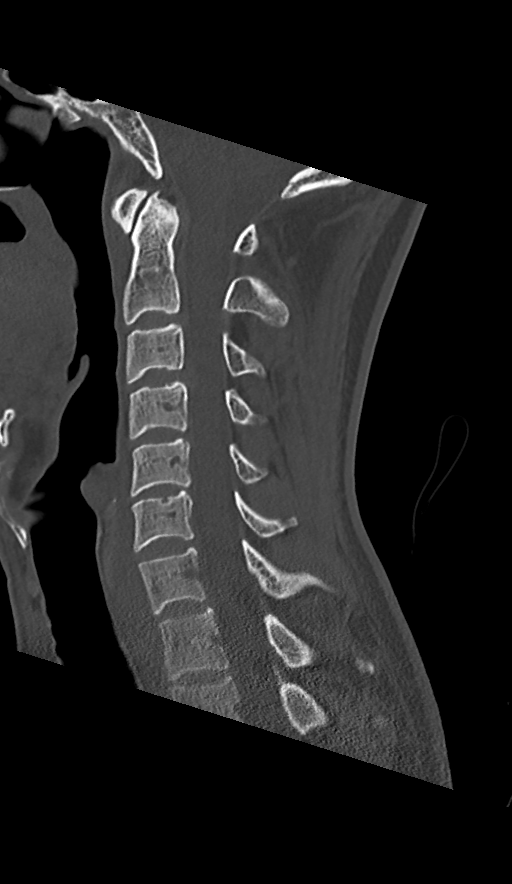
[im 36/61  bone]
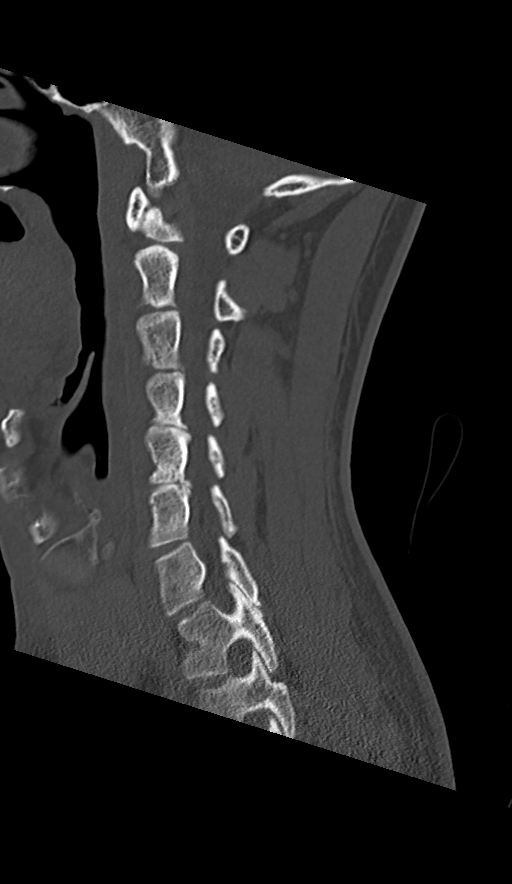
[im 41/61  bone]
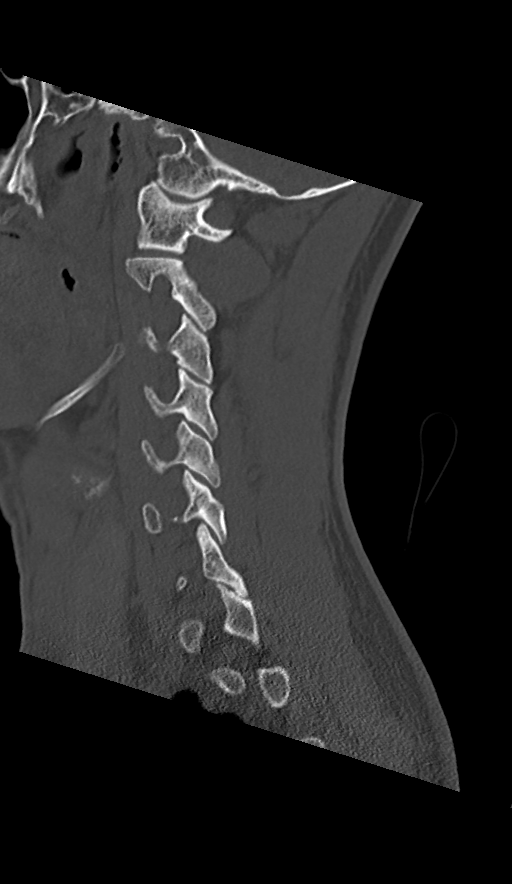

[Series 7: orthogonals · axial · 0.23mm/px · z∈[+1086,+1233]mm · 4 of 115 slices shown, 5 images]
[im 17/115  soft-tissue]
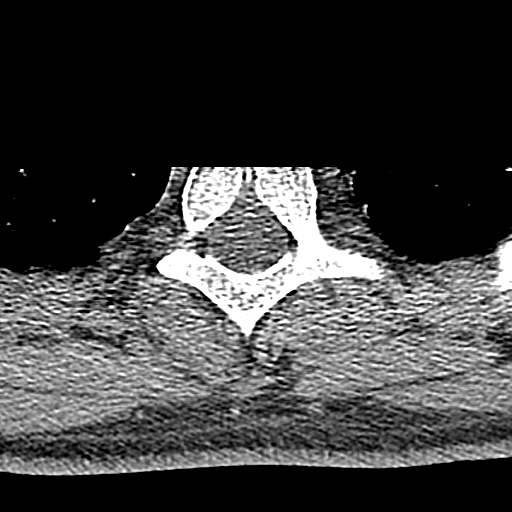
[im 17/115  bone]
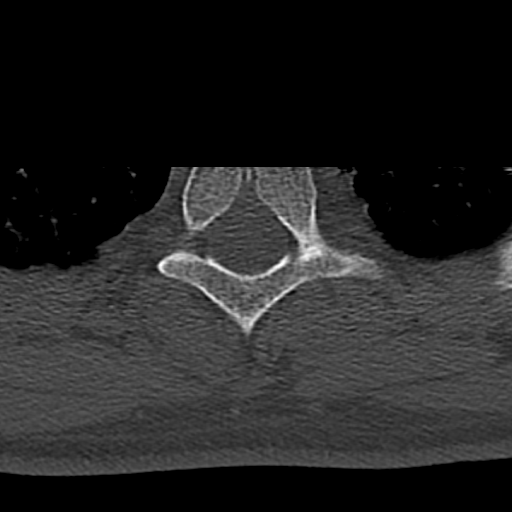
[im 49/115  bone]
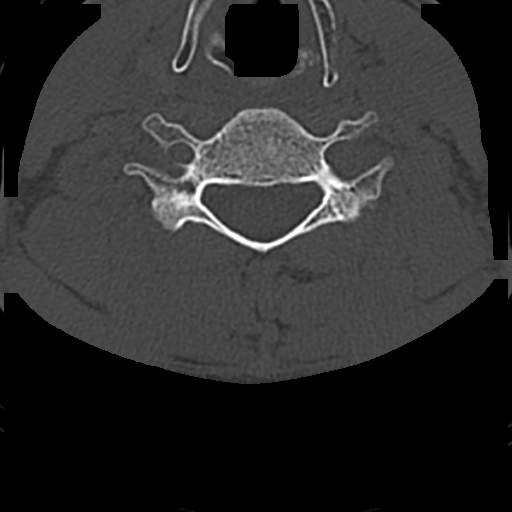
[im 66/115  bone]
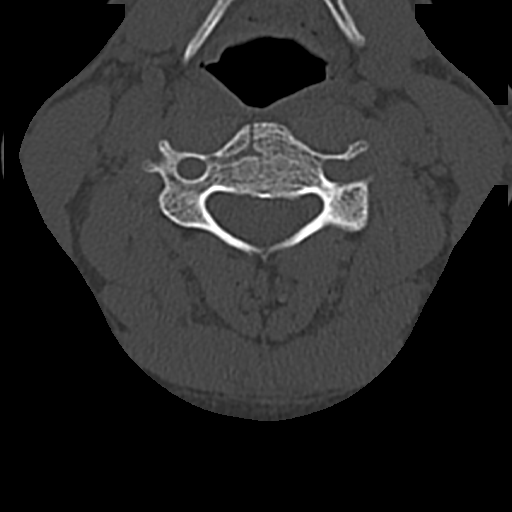
[im 98/115  bone]
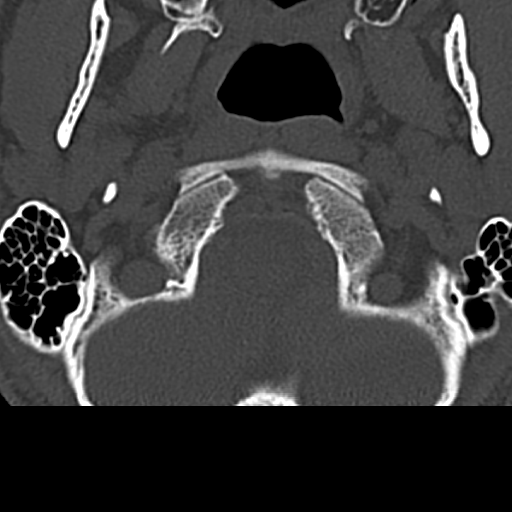

[12 of 33 positions shown; findings below may reference images not displayed]

FINDINGS: CT HEAD FINDINGS

Brain: No evidence of acute infarction, hemorrhage, hydrocephalus,
extra-axial collection or mass lesion/mass effect.

Vascular: No hyperdense vessel or unexpected calcification.

Skull: Normal. Negative for fracture or focal lesion.

Sinuses/Orbits: No acute finding.

Other: None.

CT CERVICAL SPINE FINDINGS

Alignment: Normal.

Skull base and vertebrae: No acute fracture. No primary bone lesion
or focal pathologic process.

Soft tissues and spinal canal: No prevertebral fluid or swelling. No
visible canal hematoma.

Disc levels: No significant central canal or neural foraminal
stenosis at any level.

Upper chest: Negative.

Other: None.
IMPRESSION: No acute intracranial process.

No acute fracture or traumatic subluxation of the cervical spine.

## 2023-09-09 ENCOUNTER — Other Ambulatory Visit (HOSPITAL_COMMUNITY)
Admission: RE | Admit: 2023-09-09 | Discharge: 2023-09-09 | Disposition: A | Discharge status: Home / Self Care | Payer: Self-pay | Source: Ambulatory Visit | Attending: Medical Genetics | Admitting: Medical Genetics

## 2023-09-11 DIAGNOSIS — N951 Menopausal and female climacteric states: Secondary | ICD-10-CM | POA: Diagnosis not present

## 2023-09-11 DIAGNOSIS — M5431 Sciatica, right side: Secondary | ICD-10-CM | POA: Diagnosis not present

## 2023-09-11 DIAGNOSIS — Z0000 Encounter for general adult medical examination without abnormal findings: Secondary | ICD-10-CM | POA: Diagnosis not present

## 2023-09-11 DIAGNOSIS — Z23 Encounter for immunization: Secondary | ICD-10-CM | POA: Diagnosis not present

## 2023-09-19 LAB — GENECONNECT MOLECULAR SCREEN: Genetic Analysis Overall Interpretation: NEGATIVE

## 2023-09-24 ENCOUNTER — Other Ambulatory Visit: Payer: Self-pay | Admitting: Cardiology

## 2023-10-10 ENCOUNTER — Other Ambulatory Visit: Payer: Self-pay | Admitting: Cardiology

## 2023-10-10 ENCOUNTER — Other Ambulatory Visit: Payer: Self-pay

## 2023-10-10 MED ORDER — AMLODIPINE BESYLATE 5 MG PO TABS: 5.0000 mg | ORAL_TABLET | Freq: Every day | ORAL | 3 refills | Status: DC

## 2024-02-05 DIAGNOSIS — L821 Other seborrheic keratosis: Secondary | ICD-10-CM | POA: Diagnosis not present

## 2024-02-05 DIAGNOSIS — D2372 Other benign neoplasm of skin of left lower limb, including hip: Secondary | ICD-10-CM | POA: Diagnosis not present

## 2024-02-05 DIAGNOSIS — I781 Nevus, non-neoplastic: Secondary | ICD-10-CM | POA: Diagnosis not present

## 2024-02-05 DIAGNOSIS — D1801 Hemangioma of skin and subcutaneous tissue: Secondary | ICD-10-CM | POA: Diagnosis not present

## 2024-03-05 DIAGNOSIS — Z860101 Personal history of adenomatous and serrated colon polyps: Secondary | ICD-10-CM | POA: Diagnosis not present

## 2024-03-05 DIAGNOSIS — R0789 Other chest pain: Secondary | ICD-10-CM | POA: Diagnosis not present

## 2024-03-20 ENCOUNTER — Other Ambulatory Visit: Payer: Self-pay | Admitting: Cardiology

## 2024-04-03 ENCOUNTER — Other Ambulatory Visit: Payer: Self-pay | Admitting: Cardiology

## 2024-04-13 ENCOUNTER — Encounter: Payer: Self-pay | Admitting: Cardiology

## 2024-06-02 DIAGNOSIS — Z0000 Encounter for general adult medical examination without abnormal findings: Secondary | ICD-10-CM | POA: Diagnosis not present

## 2024-06-22 ENCOUNTER — Other Ambulatory Visit: Payer: Self-pay | Admitting: Cardiology

## 2024-07-01 NOTE — Progress Notes (Unsigned)
 Cardiology Office Note:    Date:  07/02/2024   ID:  Ami Provost, DOB 29-May-1971, MRN [id number]  PCP:  Boneta Mose BRAVO, PA-C  Cardiologist:  Lonni LITTIE Nanas, MD  Electrophysiologist:  None   Referring MD: Boneta, [place]  E, *   Chief Complaint  Patient presents with   Coronary Artery Disease    History of Present Illness:    Steven Mann is a 53 y.o. male with a hx of hypertension hyperlipidemia who presents for follow-up.  He was admitted to [facility] from 5/26 through 01/19/2021 with chest pain.  CTA chest on 5/26 showed no aortic pathology but significant coronary calcifications.  Lexiscan  Myoview  on 01/19/2021 showed normal perfusion, EF 55%.  Echocardiogram on 5/27 showed normal biventricular function, no significant valvular disease.  Coronary CTA on 09/23/2022 showed mixed plaque in mid LAD with moderate (50 to 69%) stenosis but not significant by CT FFR (0.84), calcium  score 1043 (99th percentile).  Since last clinic visit, he reports he is doing well. Denies any chest pain, dyspnea, lightheadedness, syncope, lower extremity edema, or palpitations.  He runs 20 to 25 miles per week and play soccer twice a week and lifts weights 4 times per week.  Denies exertional symptoms.  Does report some swelling in legs.   Past Medical History:  Diagnosis Date   High cholesterol    Hypertension     Past Surgical History:  Procedure Laterality Date   VASECTOMY      Current Medications: Current Meds  Medication Sig   amLODipine  (NORVASC ) 5 MG tablet Take 1 tablet (5 mg total) by mouth daily.   aspirin  EC (ASPIRIN  LOW DOSE) 81 MG tablet TAKE 1 TABLET (81 MG TOTAL) BY MOUTH DAILY. SWALLOW WHOLE.   atorvastatin  (LIPITOR) 20 MG tablet TAKE 1 TABLET BY MOUTH EVERY DAY   pantoprazole (PROTONIX) 40 MG tablet Take 40 mg by mouth daily.     Allergies:   Patient has no known allergies.   Social History   Socioeconomic History   Marital status: Married    Spouse name:  Not on file   Number of children: Not on file   Years of education: Not on file   Highest education level: Not on file  Occupational History   Occupation: surveyor, minerals  Tobacco Use   Smoking status: Former    Current packs/day: 0.00    Types: Cigarettes    Start date: 07/24/1984    Quit date: 07/25/1999    Years since quitting: 24.9   Smokeless tobacco: Never  Vaping Use   Vaping status: Never Used  Substance and Sexual Activity   Alcohol use: Not Currently    Comment: occasionally    Drug use: No   Sexual activity: Not on file  Other Topics Concern   Not on file  Social History Narrative   Not on file   Social Drivers of Health   Financial Resource Strain: Not on file  Food Insecurity: Low Risk  (05/26/2024)   Received from [facility]   Hunger Vital Sign    Within the past 12 months, you worried that your food would run out before you got money to buy more: Never true    Within the past 12 months, the food you bought just didn't last and you didn't have money to get more. : Never true  Transportation Needs: No Transportation Needs (05/26/2024)   Received from [employer]    In the past 12 months, has lack of reliable  transportation kept you from medical appointments, meetings, work or from getting things needed for daily living? : No  Physical Activity: Not on file  Stress: Not on file  Social Connections: Not on file     Family History: The patient's family history includes CAD in his brother and maternal grandfather; CVA in his paternal grandfather; Heart disease in his father.  ROS:   Please see the history of present illness.     All other systems reviewed and are negative.  EKGs/Labs/Other Studies Reviewed:    The following studies were reviewed today:  NM Myocar Multi 01/19/2021: Narrative & Impression  There was no ST segment deviation noted during stress. Nuclear stress EF: 55%. No wall motion abnormalities The study is  normal. No significant perfusion defects detected at rest or stress. This is a low risk study.    Echo 01/19/2021: 1. Left ventricular ejection fraction, by estimation, is 55 to 60%. The  left ventricle has normal function. The left ventricle has no regional  wall motion abnormalities. Left ventricular diastolic parameters were  normal.   2. Right ventricular systolic function is normal. The right ventricular  size is normal. There is normal pulmonary artery systolic pressure. The  estimated right ventricular systolic pressure is 27.6 mmHg.   3. The mitral valve is grossly normal. Trivial mitral valve  regurgitation. No evidence of mitral stenosis.   4. The aortic valve is tricuspid. Aortic valve regurgitation is not  visualized. No aortic stenosis is present.   5. The inferior vena cava is normal in size with greater than 50%  respiratory variability, suggesting right atrial pressure of 3 mmHg.  CT Angio Chest Aorta 01/18/2021: IMPRESSION: 1. Normal CTA of the aorta. 2. Age advanced atheromatous coronary calcification.  EKG:   01/29/2021: NSR, rate 82, LVH 09/16/2022: Normal sinus rhythm, LVH, concave ST elevation consistent with early repolarization, rate 60 04/17/2023: Sinus bradycardia, rate 58, LVH, concave ST elevation consistent with early repolarization 07/02/2024: Sinus bradycardia, LVH, early repolarization, rate 55  Recent Labs: No results found for requested labs within last 365 days.  Recent Lipid Panel    Component Value Date/Time   CHOL 108 08/13/2021 0809   TRIG 37 08/13/2021 0809   HDL 56 08/13/2021 0809   CHOLHDL 1.9 08/13/2021 0809   CHOLHDL 3.0 01/19/2021 0503   VLDL 15 01/19/2021 0503   LDLCALC 42 08/13/2021 0809    Physical Exam:    VS:  BP 130/80 (BP Location: Left Arm, Patient Position: Sitting, Cuff Size: Normal)   Pulse (!) 55   Ht 5' 10 (1.778 m)   Wt 165 lb 3.2 oz (74.9 kg)   SpO2 99%   BMI 23.70 kg/m     Wt Readings from Last 3 Encounters:   07/02/24 165 lb 3.2 oz (74.9 kg)  04/17/23 163 lb 12.8 oz (74.3 kg)  10/08/22 165 lb 6.4 oz (75 kg)     GEN: Well nourished, well developed in no acute distress HEENT: Normal NECK: No JVD; No carotid bruits LYMPHATICS: No lymphadenopathy CARDIAC: RRR, 2/6 systolic murmur, no rubs, no gallops RESPIRATORY:  Clear to auscultation without rales, wheezing or rhonchi  ABDOMEN: Soft, non-tender, non-distended MUSCULOSKELETAL:  trace edema; No deformity  SKIN: Warm and dry NEUROLOGIC:  Alert and oriented x 3 PSYCHIATRIC:  Normal affect   ASSESSMENT:    1. CAD in native artery   2. Essential hypertension   3. Hyperlipidemia, unspecified hyperlipidemia type      PLAN:    CAD: reported  atypical chest pain.  CTA chest 01/18/2021 showed coronary calcifications.  Lexiscan  Myoview  on 01/19/2021 showed normal perfusion, EF 55%.  Echocardiogram on 5/27 showed normal biventricular function, no significant valvular disease.  Normal ESR/CRP.  Coronary CTA on 09/23/2022 showed mixed plaque in mid LAD with moderate (50 to 69%) stenosis but not significant by CT FFR (0.84), calcium  score 1043 (99th percentile). -Continue aspirin  81 mg daily -Continue atorvastatin  20 mg daily  Hypertension: On amlodipine  5 mg daily.  Appears controlled  Hyperlipidemia:  LDL 81 on 01/19/2021, started atorvastatin  20 mg daily for goal LDL less than 70.  LP(a) 28 on 04/29/2023.  LDL 49 in 06/02/24  Lightheadedness: Carotid duplex unremarkable on 09/2022  Thyroid  lesion: Cystic thyroid  lesion noted on carotid duplex 09/2022.  TSH normal.  Thyroid  ultrasound 11/15/2022 showed numerous tiny cysts and nodules, none of which met criteria to warrant biopsy or dedicated imaging surveillance  RTC in 1 year   Medication Adjustments/Labs and Tests Ordered: Current medicines are reviewed at length with the patient today.  Concerns regarding medicines are outlined above.  Orders Placed This Encounter  Procedures   EKG 12-Lead     No orders of the defined types were placed in this encounter.    Patient Instructions  Medication Instructions:  Your physician recommends that you continue on your current medications as directed. Please refer to the Current Medication list given to you today.  *If you need a refill on your cardiac medications before your next appointment, please call your pharmacy*   Follow-Up: At [facility], you and your health needs are our priority.  As part of our continuing mission to provide you with exceptional heart care, our providers are all part of one team.  This team includes your primary Cardiologist (physician) and Advanced Practice Providers or APPs (Physician Assistants and Nurse Practitioners) who all work together to provide you with the care you need, when you need it.  Your next appointment:   1 year(s)  Provider:   Lonni LITTIE Nanas, MD            Signed, Lonni LITTIE Nanas, MD  07/02/2024 3:45 PM    [facility]

## 2024-07-02 ENCOUNTER — Ambulatory Visit: Attending: Cardiology | Admitting: Cardiology

## 2024-07-02 ENCOUNTER — Encounter: Payer: Self-pay | Admitting: Cardiology

## 2024-07-02 VITALS — BP 130/80 | HR 55 | Ht 70.0 in | Wt 165.2 lb

## 2024-07-02 DIAGNOSIS — I10 Essential (primary) hypertension: Secondary | ICD-10-CM

## 2024-07-02 DIAGNOSIS — I2510 Atherosclerotic heart disease of native coronary artery without angina pectoris: Secondary | ICD-10-CM

## 2024-07-02 DIAGNOSIS — E785 Hyperlipidemia, unspecified: Secondary | ICD-10-CM | POA: Diagnosis not present

## 2024-07-02 NOTE — Patient Instructions (Signed)
 Medication Instructions:  Your physician recommends that you continue on your current medications as directed. Please refer to the Current Medication list given to you today.  *If you need a refill on your cardiac medications before your next appointment, please call your pharmacy*   Follow-Up: At [facility], you and your health needs are our priority.  As part of our continuing mission to provide you with exceptional heart care, our providers are all part of one team.  This team includes your primary Cardiologist (physician) and Advanced Practice Providers or APPs (Physician Assistants and Nurse Practitioners) who all work together to provide you with the care you need, when you need it.  Your next appointment:   1 year(s)  Provider:   Lonni LITTIE Nanas, MD

## 2024-07-17 ENCOUNTER — Other Ambulatory Visit: Payer: Self-pay | Admitting: Cardiology

## 2024-09-29 ENCOUNTER — Other Ambulatory Visit: Payer: Self-pay | Admitting: Cardiology
